# Patient Record
Sex: Female | Born: 1995 | Race: White | Hispanic: No | Marital: Married | Smoking: Never smoker
Health system: Southern US, Community
[De-identification: ages and names within clinical notes are randomized; demographics above are authoritative.]

## PROBLEM LIST (undated history)

## (undated) DIAGNOSIS — R102 Pelvic and perineal pain: Secondary | ICD-10-CM

## (undated) DIAGNOSIS — T8859XA Other complications of anesthesia, initial encounter: Secondary | ICD-10-CM

## (undated) DIAGNOSIS — N809 Endometriosis, unspecified: Secondary | ICD-10-CM

## (undated) DIAGNOSIS — G8929 Other chronic pain: Secondary | ICD-10-CM

## (undated) DIAGNOSIS — F32A Depression, unspecified: Secondary | ICD-10-CM

## (undated) DIAGNOSIS — F329 Major depressive disorder, single episode, unspecified: Secondary | ICD-10-CM

## (undated) DIAGNOSIS — F419 Anxiety disorder, unspecified: Secondary | ICD-10-CM

## (undated) HISTORY — DX: Anxiety disorder, unspecified: F41.9

## (undated) HISTORY — DX: Depression, unspecified: F32.A

## (undated) HISTORY — PX: NO PAST SURGERIES: SHX2092

## (undated) HISTORY — DX: Major depressive disorder, single episode, unspecified: F32.9

---

## 2016-11-28 ENCOUNTER — Emergency Department: Payer: 59

## 2016-11-28 ENCOUNTER — Encounter: Payer: Self-pay | Admitting: Emergency Medicine

## 2016-11-28 ENCOUNTER — Emergency Department
Admission: EM | Admit: 2016-11-28 | Discharge: 2016-11-28 | Disposition: A | Discharge status: Home / Self Care | Payer: 59 | Attending: Emergency Medicine | Admitting: Emergency Medicine

## 2016-11-28 ENCOUNTER — Telehealth: Payer: Self-pay | Admitting: Emergency Medicine

## 2016-11-28 DIAGNOSIS — N83202 Unspecified ovarian cyst, left side: Secondary | ICD-10-CM | POA: Diagnosis not present

## 2016-11-28 DIAGNOSIS — A749 Chlamydial infection, unspecified: Secondary | ICD-10-CM | POA: Insufficient documentation

## 2016-11-28 DIAGNOSIS — N83201 Unspecified ovarian cyst, right side: Secondary | ICD-10-CM

## 2016-11-28 DIAGNOSIS — R1030 Lower abdominal pain, unspecified: Secondary | ICD-10-CM

## 2016-11-28 LAB — COMPREHENSIVE METABOLIC PANEL
ALBUMIN: 4.6 g/dL (ref 3.5–5.0)
ALK PHOS: 53 U/L (ref 38–126)
ALT: 13 U/L — ABNORMAL LOW (ref 14–54)
ANION GAP: 5 (ref 5–15)
AST: 19 U/L (ref 15–41)
BUN: 15 mg/dL (ref 6–20)
CHLORIDE: 110 mmol/L (ref 101–111)
CO2: 25 mmol/L (ref 22–32)
Calcium: 9.2 mg/dL (ref 8.9–10.3)
Creatinine, Ser: 0.9 mg/dL (ref 0.44–1.00)
GFR calc non Af Amer: >60 mL/min (ref >60)
GLUCOSE: 101 mg/dL — AB (ref 65–99)
Potassium: 3.8 mmol/L (ref 3.5–5.1)
SODIUM: 140 mmol/L (ref 135–145)
Total Bilirubin: 0.8 mg/dL (ref 0.3–1.2)
Total Protein: 8.3 g/dL — ABNORMAL HIGH (ref 6.5–8.1)

## 2016-11-28 LAB — URINALYSIS, COMPLETE (UACMP) WITH MICROSCOPIC
Bilirubin Urine: NEGATIVE
GLUCOSE, UA: NEGATIVE mg/dL
KETONES UR: NEGATIVE mg/dL
Leukocytes, UA: NEGATIVE
Nitrite: POSITIVE — AB
PROTEIN: NEGATIVE mg/dL
Specific Gravity, Urine: 1.027 (ref 1.005–1.030)
pH: 5 (ref 5.0–8.0)

## 2016-11-28 LAB — CBC
HEMATOCRIT: 41.9 % (ref 35.0–47.0)
HEMOGLOBIN: 14.6 g/dL (ref 12.0–16.0)
MCH: 29.8 pg (ref 26.0–34.0)
MCHC: 34.8 g/dL (ref 32.0–36.0)
MCV: 85.6 fL (ref 80.0–100.0)
Platelets: 281 10*3/uL (ref 150–440)
RBC: 4.89 MIL/uL (ref 3.80–5.20)
RDW: 12.9 % (ref 11.5–14.5)
WBC: 8.6 10*3/uL (ref 3.6–11.0)

## 2016-11-28 LAB — CHLAMYDIA/NGC RT PCR (ARMC ONLY)
CHLAMYDIA TR: DETECTED — AB
N gonorrhoeae: NOT DETECTED

## 2016-11-28 LAB — POCT PREGNANCY, URINE: PREG TEST UR: NEGATIVE

## 2016-11-28 LAB — LIPASE, BLOOD: LIPASE: 35 U/L (ref 11–51)

## 2016-11-28 LAB — WET PREP, GENITAL
CLUE CELLS WET PREP: NONE SEEN
Sperm: NONE SEEN
Trich, Wet Prep: NONE SEEN
Yeast Wet Prep HPF POC: NONE SEEN

## 2016-11-28 MED ORDER — OXYCODONE-ACETAMINOPHEN 5-325 MG PO TABS: 1.0000 | ORAL_TABLET | ORAL | 0 refills | Status: DC | PRN

## 2016-11-28 MED ORDER — OXYCODONE-ACETAMINOPHEN 5-325 MG PO TABS
1.0000 | ORAL_TABLET | Freq: Once | ORAL | Status: AC
  Administered 2016-11-28: 1 via ORAL
  Filled 2016-11-28: qty 1

## 2016-11-28 NOTE — ED Provider Notes (Signed)
[facility] Emergency Department Provider Note    First MD Initiated Contact with Patient 11/28/16 [phone]     (approximate)  I have reviewed the triage vital signs and the nursing notes.   HISTORY  Chief Complaint No chief complaint on file.   HPI Heidi Lyons is a 21 y.o. female presents to the emergency department with 10 out of 10 pelvic pain 2 weeks which is increased in intensity since having her birth control implant replaced in her arm on Tuesday (Implanon). Patient states that she discussed this with her gynecologist who advised her that this was "normal". Patient denies any fever no urinary symptoms no vaginal discharge. Patient does admit to unprotected sex with previous history of Chlamydia. Patient denies any vomiting or diarrhea. Patient denies any constipation. Of note patient states that she did not have a pelvic exam performed before Implanon placement   Past medical history Chlamydia There are no active problems to display for this patient.    None  Prior to Admission medications   Medication Sig Start Date End Date Taking? Authorizing Provider  etonogestrel (NEXPLANON) 68 MG IMPL implant 1 each by Subdermal route once.   Yes [provider]  meloxicam (MOBIC) 7.5 MG tablet Take 1 tablet by mouth daily. 11/21/16  Yes [provider]  oxyCODONE-acetaminophen (ROXICET) 5-325 MG tablet Take 1 tablet by mouth every 4 (four) hours as needed for severe pain. 11/28/16   Darci CurrentBrown, Fleetwood N, MD    Allergies No known drug allergies History reviewed. No pertinent family history.  Social History Social History  Substance Use Topics  . Smoking status: Never Smoker  . Smokeless tobacco: Never Used  . Alcohol use Yes    Review of Systems Constitutional: No fever/chills Eyes: No visual changes. ENT: No sore throat. Cardiovascular: Denies chest pain. Respiratory: Denies shortness of breath. Gastrointestinal: No abdominal  pain.  No nausea, no vomiting.  No diarrhea.  No constipation. Genitourinary: Negative for dysuria.Positive for pelvic pain Musculoskeletal: Negative for neck pain.  Negative for back pain. Integumentary: Negative for rash. Neurological: Negative for headaches, focal weakness or numbness.   ____________________________________________   PHYSICAL EXAM:  VITAL SIGNS: ED Triage Vitals [11/28/16 0431]  Enc Vitals Group     BP 130/63     Pulse Rate 92     Resp 16     Temp 98.1 F (36.7 C)     Temp Source Oral     SpO2 99 %     Weight 59.9 kg (132 lb)     Height 1.6 m (5\' 3" )     Head Circumference      Peak Flow      Pain Score      Pain Loc      Pain Edu?      Excl. in GC?     Constitutional: Alert and oriented. Well appearing and in no acute distress. Eyes: Conjunctivae are normal. Head: Atraumatic. Mouth/Throat: Mucous membranes are moist. Neck: No stridor.   Cardiovascular: Normal rate, regular rhythm. Good peripheral circulation. Grossly normal heart sounds. Respiratory: Normal respiratory effort.  No retractions. Lungs CTAB. Gastrointestinal: Soft and nontender. No distention.  Genitourinary: Scant vaginal bleeding noted (patient menstruating) however yellowish white vaginal discharge also noted Musculoskeletal: No lower extremity tenderness nor edema. No gross deformities of extremities. Neurologic:  Normal speech and language. No gross focal neurologic deficits are appreciated.  Skin:  Skin is warm, dry and intact. No rash noted. Psychiatric: Mood and affect are  normal. Speech and behavior are normal.  ____________________________________________   LABS (all labs ordered are listed, but only abnormal results are displayed)  Labs Reviewed  CHLAMYDIA/NGC RT PCR ([facility] ONLY) - Abnormal; Notable for the following:       Result Value   Chlamydia Tr DETECTED (*)    All other components within normal limits  WET PREP, GENITAL - Abnormal; Notable for the  following:    WBC, Wet Prep HPF POC FEW (*)    All other components within normal limits  COMPREHENSIVE METABOLIC PANEL - Abnormal; Notable for the following:    Glucose, Bld 101 (*)    Total Protein 8.3 (*)    ALT 13 (*)    All other components within normal limits  URINALYSIS, COMPLETE (UACMP) WITH MICROSCOPIC - Abnormal; Notable for the following:    Color, Urine YELLOW (*)    APPearance HAZY (*)    Hgb urine dipstick SMALL (*)    Nitrite POSITIVE (*)    Bacteria, UA RARE (*)    Squamous Epithelial / LPF 0-5 (*)    All other components within normal limits  LIPASE, BLOOD  CBC  POC URINE PREG, ED  POCT PREGNANCY, URINE   _  RADIOLOGY I, [place] Trini Soldo, personally viewed and evaluated these images (plain radiographs) as part of my medical decision making, as well as reviewing the written report by the radiologist.  US Transvaginal Non-ob  Result Date: 11/28/2016 CLINICAL DATA:  Lower abdominal pain for 2 weeks EXAM: TRANSABDOMINAL AND TRANSVAGINAL ULTRASOUND OF PELVIS TECHNIQUE: Both transabdominal and transvaginal ultrasound examinations of the pelvis were performed. Transabdominal technique was performed for global imaging of the pelvis including uterus, ovaries, adnexal regions, and pelvic cul-de-sac. It was necessary to proceed with endovaginal exam following the transabdominal exam to visualize the ovaries. COMPARISON:  None FINDINGS: Uterus Measurements: 7.0 x 3.9 x 4.2 cm. No fibroids or other mass visualized. Endometrium Thickness: 4.4 mm.  No focal abnormality visualized. Right ovary Measurements: 3.6 x 2.3 x 2.8 cm. 2.3 cm simple unilocular cyst. Left ovary Measurements: 2.9 x 1.8 x 2.1 cm. 1.9 cm simple unilocular cyst. Other findings No abnormal free fluid. IMPRESSION: Normal uterus and ovaries.  No abnormal pelvic fluid collections. Electronically Signed   By: Ellery Plunk M.D.   On: 11/28/2016 06:44   US Pelvis Complete  Result Date: 11/28/2016 CLINICAL DATA:   Lower abdominal pain for 2 weeks EXAM: TRANSABDOMINAL AND TRANSVAGINAL ULTRASOUND OF PELVIS TECHNIQUE: Both transabdominal and transvaginal ultrasound examinations of the pelvis were performed. Transabdominal technique was performed for global imaging of the pelvis including uterus, ovaries, adnexal regions, and pelvic cul-de-sac. It was necessary to proceed with endovaginal exam following the transabdominal exam to visualize the ovaries. COMPARISON:  None FINDINGS: Uterus Measurements: 7.0 x 3.9 x 4.2 cm. No fibroids or other mass visualized. Endometrium Thickness: 4.4 mm.  No focal abnormality visualized. Right ovary Measurements: 3.6 x 2.3 x 2.8 cm. 2.3 cm simple unilocular cyst. Left ovary Measurements: 2.9 x 1.8 x 2.1 cm. 1.9 cm simple unilocular cyst. Other findings No abnormal free fluid. IMPRESSION: Normal uterus and ovaries.  No abnormal pelvic fluid collections. Electronically Signed   By: Ellery Plunk M.D.   On: 11/28/2016 06:44    Procedures   ____________________________________________   INITIAL IMPRESSION / ASSESSMENT AND PLAN / ED COURSE  Pertinent labs & imaging results that were available during my care of the patient were reviewed by me and considered in my medical decision making (see  chart for details).  21 year old female presenting to the emergency department pelvic pain ultrasound revealed too cyst one on each side. Concern for possible STD given history of Chlamydia and unprotected sex as such pelvic exam performed with lab data pending however patient will not remain in the emergency Department for the results. We will notify the nurse navigator to notify the patient of any positive findings.    ____________________________________________  FINAL CLINICAL IMPRESSION(S) / ED DIAGNOSES  Final diagnoses:  Cysts of both ovaries  Chlamydia   MEDICATIONS GIVEN DURING THIS VISIT:  Medications  oxyCODONE-acetaminophen (PERCOCET/ROXICET) 5-325 MG per tablet 1 tablet  (1 tablet Oral Given 11/28/16 0539)     NEW OUTPATIENT MEDICATIONS STARTED DURING THIS VISIT:  Discharge Medication List as of 11/28/2016  7:05 AM    START taking these medications   Details  oxyCODONE-acetaminophen (ROXICET) 5-325 MG tablet Take 1 tablet by mouth every 4 (four) hours as needed for severe pain., Starting Wed 11/28/2016, Print        Discharge Medication List as of 11/28/2016  7:05 AM      Discharge Medication List as of 11/28/2016  7:05 AM       Note:  This document was prepared using Dragon voice recognition software and may include unintentional dictation errors.    Darci Current, MD 11/28/16 2249

## 2016-11-28 NOTE — Telephone Encounter (Addendum)
Called patient to review std test results.  Patient was not treated in the ED.  The number listed is no longer in service.  I will send a letter.     Contacted patient via mom.  Explained result and need for treatment and partner treatment.  Will call med to [facility].

## 2016-11-28 NOTE — ED Triage Notes (Signed)
Pt  To triage in wheelchair due to pain. Pt reports she has had lower bilateral abdominal pain x2 weeks that has increased in intensity since having her birthcontrol implant placed in arm on Tuesday. Pt on period menses currently.

## 2016-11-29 NOTE — Telephone Encounter (Signed)
Per dr brown mom was present during visit and patient said her mom could be aware of her condition. Called mom and asked her on voicemail to [facility]  Patient needs azithromycin 1 gram po per dr brown and to follow up at [facility] or pcp

## 2017-01-25 ENCOUNTER — Emergency Department: Payer: 59

## 2017-01-25 ENCOUNTER — Encounter: Payer: Self-pay | Admitting: Emergency Medicine

## 2017-01-25 DIAGNOSIS — N83209 Unspecified ovarian cyst, unspecified side: Secondary | ICD-10-CM | POA: Insufficient documentation

## 2017-01-25 DIAGNOSIS — Z5321 Procedure and treatment not carried out due to patient leaving prior to being seen by health care provider: Secondary | ICD-10-CM | POA: Diagnosis not present

## 2017-01-25 DIAGNOSIS — R102 Pelvic and perineal pain: Secondary | ICD-10-CM | POA: Diagnosis present

## 2017-01-25 LAB — URINALYSIS, COMPLETE (UACMP) WITH MICROSCOPIC
BACTERIA UA: NONE SEEN
BILIRUBIN URINE: NEGATIVE
Glucose, UA: NEGATIVE mg/dL
KETONES UR: NEGATIVE mg/dL
NITRITE: NEGATIVE
PH: 7 (ref 5.0–8.0)
Protein, ur: NEGATIVE mg/dL
Specific Gravity, Urine: 1.011 (ref 1.005–1.030)

## 2017-01-25 LAB — POCT PREGNANCY, URINE: PREG TEST UR: NEGATIVE

## 2017-01-25 NOTE — ED Triage Notes (Signed)
Pt comes into the ED via POV c/o pelvic pain that has gotten worse tonight.  Patient states she has been following up with her OBGN and is supposed to have surgery in the next couple weeks to determine if it is related to endometriosis.  Patient states she also has a h/o ovarian cysts.  Patient in NAD at this time with even and unlabored respirations.

## 2017-01-26 ENCOUNTER — Emergency Department
Admission: EM | Admit: 2017-01-26 | Discharge: 2017-01-26 | Disposition: A | Discharge status: Home / Self Care | Payer: 59 | Attending: Emergency Medicine | Admitting: Emergency Medicine

## 2017-01-26 DIAGNOSIS — N83209 Unspecified ovarian cyst, unspecified side: Secondary | ICD-10-CM

## 2017-01-26 NOTE — ED Notes (Signed)
Pt updated on wait time and is understanding.  

## 2017-01-26 NOTE — ED Notes (Signed)
Pt updated on wait time and states she is going to go home and go to see a doctor in the morning. Pt encouraged to stay but did not wish to wait any longer.

## 2017-01-28 ENCOUNTER — Telehealth: Payer: Self-pay | Admitting: Emergency Medicine

## 2017-01-28 NOTE — Telephone Encounter (Signed)
Patient called asking for results of tests done prior to leaving without treatment.  She has been seeing gyn for this pain and has lap scheduled--dr beasely--caron jones.   I explained that there is no critical result, but I would recommend she call caron jones to inform her of this pain and that ultrasound and ua were done here and need review.  Pt agrees.

## 2017-01-31 ENCOUNTER — Encounter
Admission: RE | Admit: 2017-01-31 | Discharge: 2017-01-31 | Disposition: A | Discharge status: Home / Self Care | Payer: 59 | Source: Ambulatory Visit | Attending: Obstetrics and Gynecology | Admitting: Obstetrics and Gynecology

## 2017-01-31 NOTE — H&P (Signed)
HPI: She states this worsened after getting the Nexplanon 2 yrs ago and is negatively impacting her quality of life. It has  been worsening over time, and started with menarche at age 21yo. She states the pain is constant, chronic, worsens 1-2 days prior to periods, resolves during period and comes back just after. Resting makes it better, moving makes it worse. Narcotics did not change the pain. She has also tried OCPs x3 versions without success.   She seems depressed and defeated, and has a flat affect today.  She has had prior TVUS, which was done on 01/10/17 and demonstrated simple cysts bilaterally, cervical fluid, no evidence of hydrosalpinx, +pelvic free fluid.  Labs: normal. No GC/CT on 01/10/17 here but Chlamydia positive and incompletely [facility]. No prior hx of STDs of which she is aware.   Past Medical History:  has a past medical history of Chlamydia and History of chickenpox.  Past Surgical History:  has no past surgical history on file. Family History: family history includes Breast cancer in her mother. Social History:  reports that she has never smoked. She has never used smokeless tobacco. She reports that she drinks alcohol. She reports that she does not use drugs. OB/GYN History:          OB History    Gravida Para Term Preterm AB Living   0 0 0 0 0 0   SAB TAB Ectopic Molar Multiple Live Births   0 0 0   0        Allergies: has No Known Allergies. Medications:  Current Outpatient Prescriptions:  .  doxycycline (MONODOX) 100 MG capsule, Take 1 capsule (100 mg total) by mouth 2 (two) times daily., Disp: 28 capsule, Rfl: 0 .  meloxicam (MOBIC) 7.5 MG tablet, TAKE 1 TABLET (7.5 MG TOTAL) BY MOUTH ONCE DAILY., Disp: 30 tablet, Rfl: 0 .  metroNIDAZOLE (FLAGYL) 500 MG tablet, Take 1 tablet (500 mg total) by mouth 2 (two) times daily for 14 days., Disp: 28 tablet, Rfl: 0 .  metroNIDAZOLE (METROGEL) 0.75 % vaginal gel, Place 1 applicator  vaginally nightly., Disp: 70 g, Rfl: 0 .  oxyCODONE (OXYCONTIN) 10 MG CR tablet, Take 10 mg by mouth every 12 (twelve) hours as needed for Pain., Disp: , Rfl:  .  danazol (DANOCRINE) 100 MG capsule, Take 1 capsule (100 mg total) by mouth 2 (two) times daily., Disp: 60 capsule, Rfl: 3 .  pregabalin (LYRICA) 150 MG capsule, Take 1 capsule (150 mg total) by mouth 2 (two) times daily., Disp: 60 capsule, Rfl: 3   Review of Systems: No SOB, no palpitations or chest pain, no new lower extremity edema, no nausea or vomiting or bowel or bladder complaints. See HPI for gyn specific ROS.   Exam:   BP 122/67   Pulse 74   Ht 157.5 cm (5\' 2" )   Wt 59.9 kg (132 lb)   BMI 24.14 kg/m   General: Patient is well-groomed, well-nourished, appears stated age in no acute distress  HEENT: head is atraumatic and normocephalic, trachea is midline, neck is supple with no palpable nodules  CV: Regular rhythm and normal heart rate, no murmur  Pulm: Clear to auscultation throughout lung fields with no wheezing, crackles, or rhonchi. No increased work of breathing  Abdomen: soft , no mass, diffusely-tender in R and L lower quadrants, no rebound tenderness, no hepatomegaly  Pelvic:  Deferred at patient's request until surgery  Impression:   The encounter diagnosis was Pelvic  pain in female.    Plan:   Chronic Pelvic pain: I have reviewed the patient's history and physical exam.  I reviewed the differential diagnosis of pelvic pain, including GYN, urologic, gastrointestinal, musculoskeletal, neurological and psychological causes. She has tried OCPs and now Nexplanon for years without resolution. I have offered her a trial of Depo Lupron or Depo provera, a referral to the pelvic pain clinic at [facility], and a dx lap. We will also trial Lyrica and danazol prior to the surgery to see if any improvement.  At this time she would like to proceed with exploratory laparoscopy, and we will do  chromopertubation as well. If feel this is reasonable given her recent dx of CT with incomplete treatment. She is aware that this surgery may not resolve any of her pain, and that I will refer her to the chronic pelvic pain clinic if that is the case. Possible LOA, possible fulguration of endometriosis.   She was in the ER earlier this week for pelvic pain, but was not seen by a doctor.   The patient and I discussed the technical aspects of the procedure including the potential for risks and complications. These include but are not limited to the risk of infection requiring post-operative antibiotics or further procedures. We talked about the risk of injury to adjacent organs including bladder, bowel, ureter, blood vessels or nerves. We talked about the need to convert to an open incision. We talked about the possible need for blood transfusion. We talked aboutpostop complications such asthromboembolic or cardiopulmonary complications. All of her questions were answered.  Her preoperative exam was completed and the appropriate consents were signed. She is scheduled to undergo this procedure in the near future.  Specific Peri-operative Considerations:  - Consent: obtained today  - Labs: CBC, CMP preoperatively  - Bowel Preparation: None required - Abx:  Doxycycline 2 days prior and 3 day after procedure - VTE ppx: SCDs perioperatively - Glucose Protocol: n/a - Beta-blockade: n/a

## 2017-01-31 NOTE — Patient Instructions (Signed)
Your procedure is scheduled on: 02-08-17 FRIDAY Report to Same Day Surgery 2nd floor medical mall [facility](Medical Mall Entrance-take elevator on left to 2nd floor.  Check in with surgery information desk.) To find out your arrival time please call [phone] between 1PM - 3PM on 02-07-17 THURSDAY  Remember: Instructions that are not followed completely may result in serious medical risk, up to and including death, or upon the discretion of your surgeon and anesthesiologist your surgery may need to be rescheduled.    _x___ 1. Do not eat food after midnight the night before your procedure. NO GUM CHEWING OR HARD CANDIES. You may drink clear liquids up to 2 hours before you are scheduled to arrive at the hospital for your procedure.  Do not drink clear liquids within 2 hours of your scheduled arrival to the hospital.  Clear liquids include  --Water or Apple juice without pulp  --Clear carbohydrate beverage such as ClearFast or Gatorade  --Black Coffee or Clear Tea (No milk, no creamers, do not add anything to the coffee or Tea)  Type 1 and type 2 diabetics should only drink water.     __x__ 2. No Alcohol for 24 hours before or after surgery.   __x__3. No Smoking for 24 prior to surgery.   ____  4. Bring all medications with you on the day of surgery if instructed.    __x__ 5. Notify your doctor if there is any change in your medical condition     (cold, fever, infections).     Do not wear jewelry, make-up, hairpins, clips or nail polish.  Do not wear lotions, powders, or perfumes. You may wear deodorant.  Do not shave 48 hours prior to surgery. Men may shave face and neck.  Do not bring valuables to the hospital.    [facility] is not responsible for any belongings or valuables.               Contacts, dentures or bridgework may not be worn into surgery.  Leave your suitcase in the car. After surgery it may be brought to your room.  For patients admitted to the hospital, discharge time is  determined by your                       treatment team.   Patients discharged the day of surgery will not be allowed to drive home.  You will need someone to drive you home and stay with you the night of your procedure.    Please read over the following fact sheets that you were given:   [facility] Preparing for Surgery and or MRSA Information   _x___ TAKE THE FOLLOWING MEDICATIONS THE MORNING OF SURGERY WITH A SMALL SIP OF WATER. These include:  1. LYRICA  2.  3.  4.  5.  6.  ____Fleets enema or Magnesium Citrate as directed.   _x___ Use CHG Soap or sage wipes as directed on instruction sheet   ____ Use inhalers on the day of surgery and bring to hospital day of surgery  ____ Stop Metformin and Janumet 2 days prior to surgery.    ____ Take 1/2 of usual insulin dose the night before surgery and none on the morning surgery.   ____ Follow recommendations from Cardiologist, Pulmonologist or PCP regarding stopping Aspirin, Coumadin, Plavix ,Eliquis, Effient, or Pradaxa, and Pletal.  X____Stop Anti-inflammatories such as Advil, Aleve, Ibuprofen, Motrin, Naproxen, Naprosyn, Goodies powders or aspirin products NOW-OK to take Tylenol  ____ Stop supplements until after surgery.     ____ Bring C-Pap to the hospital.

## 2017-02-05 ENCOUNTER — Encounter
Admission: RE | Admit: 2017-02-05 | Discharge: 2017-02-05 | Disposition: A | Discharge status: Home / Self Care | Payer: 59 | Source: Ambulatory Visit | Attending: Obstetrics and Gynecology | Admitting: Obstetrics and Gynecology

## 2017-02-05 DIAGNOSIS — G8929 Other chronic pain: Secondary | ICD-10-CM | POA: Diagnosis not present

## 2017-02-05 DIAGNOSIS — Z803 Family history of malignant neoplasm of breast: Secondary | ICD-10-CM | POA: Diagnosis not present

## 2017-02-05 DIAGNOSIS — R102 Pelvic and perineal pain: Secondary | ICD-10-CM | POA: Diagnosis present

## 2017-02-05 DIAGNOSIS — N801 Endometriosis of ovary: Secondary | ICD-10-CM | POA: Diagnosis not present

## 2017-02-05 LAB — CBC
HEMATOCRIT: 44.4 % (ref 35.0–47.0)
Hemoglobin: 15.3 g/dL (ref 12.0–16.0)
MCH: 30.2 pg (ref 26.0–34.0)
MCHC: 34.4 g/dL (ref 32.0–36.0)
MCV: 87.8 fL (ref 80.0–100.0)
Platelets: 298 10*3/uL (ref 150–440)
RBC: 5.06 MIL/uL (ref 3.80–5.20)
RDW: 13.2 % (ref 11.5–14.5)
WBC: 7 10*3/uL (ref 3.6–11.0)

## 2017-02-05 LAB — TYPE AND SCREEN
ABO/RH(D): B POS
Antibody Screen: NEGATIVE

## 2017-02-05 LAB — BASIC METABOLIC PANEL
ANION GAP: 9 (ref 5–15)
BUN: 14 mg/dL (ref 6–20)
CALCIUM: 9.5 mg/dL (ref 8.9–10.3)
CO2: 26 mmol/L (ref 22–32)
Chloride: 106 mmol/L (ref 101–111)
Creatinine, Ser: 0.9 mg/dL (ref 0.44–1.00)
GFR calc Af Amer: >60 mL/min (ref >60)
GFR calc non Af Amer: >60 mL/min (ref >60)
GLUCOSE: 80 mg/dL (ref 65–99)
POTASSIUM: 3.6 mmol/L (ref 3.5–5.1)
Sodium: 141 mmol/L (ref 135–145)

## 2017-02-07 NOTE — Pre-Procedure Instructions (Signed)
SPOKE WITH OR REGARDING 2 EAR PIERCINGS THAT PT IS UNABLE TO REMOVE-  OR AWARE AND WILL PLACE TAPE OVER PIERCINGS DURING SURGERY

## 2017-02-08 ENCOUNTER — Encounter
Admission: RE | Disposition: A | Discharge status: Home / Self Care | Payer: Self-pay | Source: Ambulatory Visit | Attending: Obstetrics and Gynecology

## 2017-02-08 ENCOUNTER — Ambulatory Visit
Admission: RE | Admit: 2017-02-08 | Discharge: 2017-02-08 | Disposition: A | Discharge status: Home / Self Care | Payer: 59 | Source: Ambulatory Visit | Attending: Obstetrics and Gynecology | Admitting: Obstetrics and Gynecology

## 2017-02-08 ENCOUNTER — Encounter: Payer: Self-pay | Admitting: *Deleted

## 2017-02-08 ENCOUNTER — Ambulatory Visit: Payer: 59 | Admitting: Anesthesiology

## 2017-02-08 DIAGNOSIS — G8929 Other chronic pain: Secondary | ICD-10-CM | POA: Insufficient documentation

## 2017-02-08 DIAGNOSIS — N801 Endometriosis of ovary: Secondary | ICD-10-CM | POA: Insufficient documentation

## 2017-02-08 DIAGNOSIS — R102 Pelvic and perineal pain: Secondary | ICD-10-CM | POA: Insufficient documentation

## 2017-02-08 DIAGNOSIS — Z803 Family history of malignant neoplasm of breast: Secondary | ICD-10-CM | POA: Insufficient documentation

## 2017-02-08 HISTORY — PX: CHROMOPERTUBATION: SHX6288

## 2017-02-08 HISTORY — PX: LAPAROSCOPY: SHX197

## 2017-02-08 LAB — POCT PREGNANCY, URINE: PREG TEST UR: NEGATIVE

## 2017-02-08 LAB — ABO/RH: ABO/RH(D): B POS

## 2017-02-08 SURGERY — LAPAROSCOPY OPERATIVE
Anesthesia: General | Wound class: Clean Contaminated

## 2017-02-08 MED ORDER — DOCUSATE SODIUM 100 MG PO CAPS: 100.0000 mg | ORAL_CAPSULE | Freq: Two times a day (BID) | ORAL | 0 refills | Status: DC

## 2017-02-08 MED ORDER — IBUPROFEN 800 MG PO TABS: 800.0000 mg | ORAL_TABLET | Freq: Three times a day (TID) | ORAL | 1 refills | Status: DC | PRN

## 2017-02-08 MED ORDER — SUCCINYLCHOLINE CHLORIDE 20 MG/ML IJ SOLN
INTRAMUSCULAR | Status: AC
  Filled 2017-02-08: qty 1

## 2017-02-08 MED ORDER — ROCURONIUM BROMIDE 100 MG/10ML IV SOLN
INTRAVENOUS | Status: DC | PRN
  Administered 2017-02-08: 30 mg via INTRAVENOUS
  Administered 2017-02-08: 5 mg via INTRAVENOUS
  Administered 2017-02-08: 10 mg via INTRAVENOUS

## 2017-02-08 MED ORDER — MIDAZOLAM HCL 2 MG/2ML IJ SOLN
INTRAMUSCULAR | Status: AC
  Filled 2017-02-08: qty 2

## 2017-02-08 MED ORDER — FENTANYL CITRATE (PF) 100 MCG/2ML IJ SOLN
25.0000 ug | INTRAMUSCULAR | Status: DC | PRN
  Administered 2017-02-08 (×2): 25 ug via INTRAVENOUS

## 2017-02-08 MED ORDER — FAMOTIDINE 20 MG PO TABS
20.0000 mg | ORAL_TABLET | Freq: Once | ORAL | Status: AC
  Administered 2017-02-08: 20 mg via ORAL

## 2017-02-08 MED ORDER — FENTANYL CITRATE (PF) 100 MCG/2ML IJ SOLN
INTRAMUSCULAR | Status: AC
  Filled 2017-02-08: qty 2

## 2017-02-08 MED ORDER — MIDAZOLAM HCL 2 MG/2ML IJ SOLN
INTRAMUSCULAR | Status: DC | PRN
  Administered 2017-02-08: 2 mg via INTRAVENOUS

## 2017-02-08 MED ORDER — SUGAMMADEX SODIUM 200 MG/2ML IV SOLN
INTRAVENOUS | Status: DC | PRN
  Administered 2017-02-08: 120 mg via INTRAVENOUS

## 2017-02-08 MED ORDER — LACTATED RINGERS IV SOLN: INTRAVENOUS | Status: DC

## 2017-02-08 MED ORDER — ACETAMINOPHEN 500 MG PO TABS: 1000.0000 mg | ORAL_TABLET | Freq: Four times a day (QID) | ORAL | 0 refills | Status: AC

## 2017-02-08 MED ORDER — ONDANSETRON HCL 4 MG/2ML IJ SOLN
INTRAMUSCULAR | Status: AC
  Filled 2017-02-08: qty 2

## 2017-02-08 MED ORDER — PROPOFOL 10 MG/ML IV BOLUS
INTRAVENOUS | Status: DC | PRN
Start: 2017-02-08 — End: 2017-02-08
  Administered 2017-02-08: 130 mg via INTRAVENOUS

## 2017-02-08 MED ORDER — SILVER NITRATE-POT NITRATE 75-25 % EX MISC
CUTANEOUS | Status: DC | PRN
  Administered 2017-02-08: 2

## 2017-02-08 MED ORDER — FENTANYL CITRATE (PF) 100 MCG/2ML IJ SOLN
INTRAMUSCULAR | Status: AC
  Administered 2017-02-08: 25 ug via INTRAVENOUS
  Filled 2017-02-08: qty 2

## 2017-02-08 MED ORDER — DEXAMETHASONE SODIUM PHOSPHATE 10 MG/ML IJ SOLN
INTRAMUSCULAR | Status: DC | PRN
  Administered 2017-02-08: 4 mg via INTRAVENOUS

## 2017-02-08 MED ORDER — BUPIVACAINE HCL 0.5 % IJ SOLN
INTRAMUSCULAR | Status: DC | PRN
  Administered 2017-02-08: 15 mL

## 2017-02-08 MED ORDER — ONDANSETRON HCL 4 MG/2ML IJ SOLN
INTRAMUSCULAR | Status: DC | PRN
  Administered 2017-02-08: 4 mg via INTRAVENOUS

## 2017-02-08 MED ORDER — LIDOCAINE HCL (CARDIAC) 20 MG/ML IV SOLN
INTRAVENOUS | Status: DC | PRN
  Administered 2017-02-08: 50 mg via INTRAVENOUS

## 2017-02-08 MED ORDER — OXYCODONE HCL 5 MG PO TABS
ORAL_TABLET | ORAL | Status: AC
  Administered 2017-02-08: 5 mg via ORAL
  Filled 2017-02-08: qty 1

## 2017-02-08 MED ORDER — LIDOCAINE HCL (PF) 2 % IJ SOLN
INTRAMUSCULAR | Status: AC
  Filled 2017-02-08: qty 2

## 2017-02-08 MED ORDER — FAMOTIDINE 20 MG PO TABS
ORAL_TABLET | ORAL | Status: AC
  Filled 2017-02-08: qty 1

## 2017-02-08 MED ORDER — OXYCODONE HCL 5 MG PO CAPS: 5.0000 mg | ORAL_CAPSULE | Freq: Four times a day (QID) | ORAL | 0 refills | Status: DC | PRN

## 2017-02-08 MED ORDER — METHYLENE BLUE 0.5 % INJ SOLN
INTRAVENOUS | Status: DC | PRN
  Administered 2017-02-08: 5 mL via SUBMUCOSAL

## 2017-02-08 MED ORDER — ONDANSETRON HCL 4 MG/2ML IJ SOLN: 4.0000 mg | Freq: Once | INTRAMUSCULAR | Status: DC | PRN

## 2017-02-08 MED ORDER — METHYLENE BLUE 0.5 % INJ SOLN
INTRAVENOUS | Status: AC
  Filled 2017-02-08: qty 10

## 2017-02-08 MED ORDER — PROPOFOL 10 MG/ML IV BOLUS
INTRAVENOUS | Status: AC
  Filled 2017-02-08: qty 20

## 2017-02-08 MED ORDER — OXYCODONE HCL 5 MG PO TABS
5.0000 mg | ORAL_TABLET | Freq: Four times a day (QID) | ORAL | Status: DC | PRN
  Administered 2017-02-08: 5 mg via ORAL
  Filled 2017-02-08 (×2): qty 1

## 2017-02-08 MED ORDER — FENTANYL CITRATE (PF) 100 MCG/2ML IJ SOLN
INTRAMUSCULAR | Status: DC | PRN
  Administered 2017-02-08 (×2): 50 ug via INTRAVENOUS

## 2017-02-08 MED ORDER — PHENYLEPHRINE HCL 10 MG/ML IJ SOLN
INTRAMUSCULAR | Status: AC
  Filled 2017-02-08: qty 1

## 2017-02-08 MED ORDER — SUGAMMADEX SODIUM 200 MG/2ML IV SOLN
INTRAVENOUS | Status: AC
  Filled 2017-02-08: qty 2

## 2017-02-08 MED ORDER — KETOROLAC TROMETHAMINE 30 MG/ML IJ SOLN
INTRAMUSCULAR | Status: DC | PRN
  Administered 2017-02-08: 30 mg via INTRAVENOUS

## 2017-02-08 MED ORDER — ROCURONIUM BROMIDE 50 MG/5ML IV SOLN
INTRAVENOUS | Status: AC
  Filled 2017-02-08: qty 1

## 2017-02-08 MED ORDER — DEXAMETHASONE SODIUM PHOSPHATE 10 MG/ML IJ SOLN
INTRAMUSCULAR | Status: AC
  Filled 2017-02-08: qty 1

## 2017-02-08 MED ORDER — SUCCINYLCHOLINE CHLORIDE 20 MG/ML IJ SOLN
INTRAMUSCULAR | Status: DC | PRN
Start: 2017-02-08 — End: 2017-02-08
  Administered 2017-02-08: 70 mg via INTRAVENOUS

## 2017-02-08 MED ORDER — PHENYLEPHRINE HCL 10 MG/ML IJ SOLN
INTRAMUSCULAR | Status: DC | PRN
Start: 2017-02-08 — End: 2017-02-08
  Administered 2017-02-08 (×2): 100 ug via INTRAVENOUS
  Administered 2017-02-08 (×3): 50 ug via INTRAVENOUS
  Administered 2017-02-08: 100 ug via INTRAVENOUS

## 2017-02-08 MED ORDER — LACTATED RINGERS IV SOLN
INTRAVENOUS | Status: DC
  Administered 2017-02-08: 07:00:00 via INTRAVENOUS

## 2017-02-08 MED ORDER — BUPIVACAINE HCL (PF) 0.5 % IJ SOLN
INTRAMUSCULAR | Status: AC
  Filled 2017-02-08: qty 30

## 2017-02-08 MED ORDER — SILVER NITRATE-POT NITRATE 75-25 % EX MISC
CUTANEOUS | Status: AC
  Filled 2017-02-08: qty 1

## 2017-02-08 MED ORDER — KETOROLAC TROMETHAMINE 30 MG/ML IJ SOLN
INTRAMUSCULAR | Status: AC
  Filled 2017-02-08: qty 1

## 2017-02-08 SURGICAL SUPPLY — 40 items
BAG URO DRAIN 2000ML W/SPOUT (MISCELLANEOUS) IMPLANT
BLADE SURG SZ11 CARB STEEL (BLADE) ×4 IMPLANT
CATH FOLEY 2WAY  5CC 16FR (CATHETERS)
CATH ROBINSON RED A/P 16FR (CATHETERS) ×4 IMPLANT
CATH URTH 16FR FL 2W BLN LF (CATHETERS) IMPLANT
CHLORAPREP W/TINT 26ML (MISCELLANEOUS) ×4 IMPLANT
CLOSURE WOUND 1/4X4 (GAUZE/BANDAGES/DRESSINGS) ×1
DERMABOND ADVANCED (GAUZE/BANDAGES/DRESSINGS) ×2
DERMABOND ADVANCED .7 DNX12 (GAUZE/BANDAGES/DRESSINGS) ×2 IMPLANT
DRSG TEGADERM 2-3/8X2-3/4 SM (GAUZE/BANDAGES/DRESSINGS) ×12 IMPLANT
GAUZE SPONGE NON-WVN 2X2 STRL (MISCELLANEOUS) ×4 IMPLANT
GLOVE BIO SURGEON STRL SZ7 (GLOVE) ×16 IMPLANT
GLOVE INDICATOR 7.5 STRL GRN (GLOVE) ×16 IMPLANT
GOWN STRL REUS W/ TWL LRG LVL3 (GOWN DISPOSABLE) ×4 IMPLANT
GOWN STRL REUS W/TWL LRG LVL3 (GOWN DISPOSABLE) ×4
IRRIGATION STRYKERFLOW (MISCELLANEOUS) IMPLANT
IRRIGATOR STRYKERFLOW (MISCELLANEOUS)
IV NS 1000ML (IV SOLUTION) ×2
IV NS 1000ML BAXH (IV SOLUTION) ×2 IMPLANT
KIT PINK PAD W/HEAD ARE REST (MISCELLANEOUS) ×4
KIT PINK PAD W/HEAD ARM REST (MISCELLANEOUS) ×2 IMPLANT
KIT RM TURNOVER CYSTO AR (KITS) ×4 IMPLANT
LABEL OR SOLS (LABEL) ×4 IMPLANT
LIGASURE LAP MARYLAND 5MM 37CM (ELECTROSURGICAL) IMPLANT
NS IRRIG 500ML POUR BTL (IV SOLUTION) ×4 IMPLANT
PACK GYN LAPAROSCOPIC (MISCELLANEOUS) ×4 IMPLANT
PAD OB MATERNITY 4.3X12.25 (PERSONAL CARE ITEMS) ×4 IMPLANT
PAD PREP 24X41 OB/GYN DISP (PERSONAL CARE ITEMS) ×4 IMPLANT
POUCH SPECIMEN RETRIEVAL 10MM (ENDOMECHANICALS) IMPLANT
SCISSORS METZENBAUM CVD 33 (INSTRUMENTS) IMPLANT
SLEEVE ENDOPATH XCEL 5M (ENDOMECHANICALS) ×8 IMPLANT
SPONGE VERSALON 2X2 STRL (MISCELLANEOUS) ×4
STRIP CLOSURE SKIN 1/4X4 (GAUZE/BANDAGES/DRESSINGS) ×3 IMPLANT
SUT MNCRL AB 4-0 PS2 18 (SUTURE) ×4 IMPLANT
SUT VIC AB 2-0 UR6 27 (SUTURE) IMPLANT
SUT VIC AB 4-0 SH 27 (SUTURE)
SUT VIC AB 4-0 SH 27XANBCTRL (SUTURE) IMPLANT
SWABSTK COMLB BENZOIN TINCTURE (MISCELLANEOUS) ×4 IMPLANT
TROCAR XCEL NON-BLD 5MMX100MML (ENDOMECHANICALS) ×4 IMPLANT
TUBING INSUFFLATOR HI FLOW (MISCELLANEOUS) ×4 IMPLANT

## 2017-02-08 NOTE — Op Note (Signed)
Heidi Lyons PROCEDURE DATE: 02/08/2017  PREOPERATIVE DIAGNOSIS: Chronic pelvic pain POSTOPERATIVE DIAGNOSIS: Endometriosis, bilateral tubal patency PROCEDURE: Diagnostic laparoscopy, fulguration of endometriosis, posterior cul de sac peritoneal stripping, peritoneal biopsies, excision of endometriosis, chromotubation SURGEON:  Dr. Christeen DouglasBethany Jathen Sudano ASSISTANT: CST ANESTHESIOLOGIST: Berdine Addisonhomas, Mathai, MD Anesthesiologist: Berdine Addisonhomas, Mathai, MD CRNA: Casey BurkittHoang, Thuy, CRNA; Mathews ArgyleLogan, Benjamin, CRNA  INDICATIONS: 21 y.o. G0 with history of chronic pelvic pain desiring surgical evaluation.   Please see preoperative notes for further details.  Risks of surgery were discussed with the patient including but not limited to: bleeding which may require transfusion or reoperation; infection which may require antibiotics; injury to bowel, bladder, ureters or other surrounding organs; need for additional procedures including laparotomy; thromboembolic phenomenon, incisional problems and other postoperative/anesthesia complications. Written informed consent was obtained.    FINDINGS:  Small uterus, normal ovaries and fallopian tubes bilaterally. Diffuse Stage II endometriosis with nothing evident on the anterior peritoneum. Scar tissue noted bilateral uterosacral segments. Scar tissue bilaterally ovarian fossa. 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 Some adhesions between the left rectosigmoid and the left pelvic sidewall. Peritoneal biopsies were taken and sent to pathology. No other abdominal/pelvic abnormality.  Normal upper abdomen.  ANESTHESIA:    General INTRAVENOUS FLUIDS: 500 ml ESTIMATED BLOOD LOSS: minimal URINE OUTPUT: 50 ml SPECIMENS: Peritoneal biopsies COMPLICATIONS: None immediate  PROCEDURE IN DETAIL:  The patient had sequential compression devices applied to her lower extremities while in the preoperative area.  She was then taken to the operating room where general  anesthesia was administered and was found to be adequate.  She was placed in the dorsal lithotomy position, and was prepped and draped in a sterile manner.  A Foley catheter was inserted into her bladder and attached to constant drainage and a uterine manipulator was then advanced into the uterus .  After an adequate timeout was performed, attention was turned to the abdomen where an umbilical incision was made with the scalpel.  The Optiview 5-mm trocar and sleeve were then advanced without difficulty with the laparoscope under direct visualization into the abdomen.  The abdomen was then insufflated with carbon dioxide gas and adequate pneumoperitoneum was obtained.   A detailed survey of the patient's pelvis and abdomen revealed the findings as mentioned above. Chromotubation revealed bilateral spillage.  Biopsy forceps were used to take peritoneal biopsies.    The posterior cul de sac peritoneum was carefully dissected away from the underlying tissue and removed. Careful attention was paid to the bowels to avoid cautery or sharp injury. The endometriosis over the uterosacral ligaments was excised and touch cautery used to maintain hemostasis and fulgurate endometriosis implants and scarring.  The operative site was surveyed, and it was found to be hemostatic.  No intraoperative injury to surrounding organs was noted. Bilateral ureters were vermiculating.   Pictures were taken of the quadrants and pelvis. The abdomen was desufflated and all instruments were then removed from the patient's abdomen. The uterine manipulator was removed without complications.  All incisions were closed with 4-0 Vicryl and Dermabond.   The patient tolerated the procedures well.  All instruments, needles, and sponge counts were correct x 2. The patient was taken to the recovery room in stable condition.

## 2017-02-08 NOTE — Anesthesia Preprocedure Evaluation (Signed)
Anesthesia Evaluation  Patient identified by MRN, date of birth, ID band Patient awake    Reviewed: Allergy & Precautions, NPO status , Patient's Chart, lab work & pertinent test results, reviewed documented beta blocker date and time   Airway Mallampati: II  TM Distance: >3 FB     Dental  (+) Chipped   Pulmonary           Cardiovascular      Neuro/Psych    GI/Hepatic   Endo/Other    Renal/GU      Musculoskeletal   Abdominal   Peds  Hematology   Anesthesia Other Findings   Reproductive/Obstetrics                             Anesthesia Physical Anesthesia Plan  ASA: II  Anesthesia Plan: General   Post-op Pain Management:    Induction: Intravenous  PONV Risk Score and Plan:   Airway Management Planned: Oral ETT  Additional Equipment:   Intra-op Plan:   Post-operative Plan:   Informed Consent: I have reviewed the patients History and Physical, chart, labs and discussed the procedure including the risks, benefits and alternatives for the proposed anesthesia with the patient or authorized representative who has indicated his/her understanding and acceptance.     Plan Discussed with: CRNA  Anesthesia Plan Comments:         Anesthesia Quick Evaluation  

## 2017-02-08 NOTE — Anesthesia Postprocedure Evaluation (Signed)
Anesthesia Post Note  Patient: Heidi Lyons  Procedure(s) Performed: Procedure(s) (LRB): LAPAROSCOPY OPERATIVE WITH PERITONEAL BIOPSIES AND FULGERATION OF ENDOMETRIOSIS (N/A) CHROMOPERTUBATION (Bilateral)  Patient location during evaluation: PACU Anesthesia Type: General Level of consciousness: awake and alert Pain management: pain level controlled Vital Signs Assessment: post-procedure vital signs reviewed and stable Respiratory status: spontaneous breathing, nonlabored ventilation, respiratory function stable and patient connected to nasal cannula oxygen Cardiovascular status: blood pressure returned to baseline and stable Postop Assessment: no signs of nausea or vomiting Anesthetic complications: no     Last Vitals:  Vitals:   02/08/17 1146 02/08/17 1215  BP:  (!) 103/59  Pulse: 70   Resp:  18  Temp: 37.1 C   SpO2: 100% 100%    Last Pain:  Vitals:   02/08/17 1233  TempSrc:   PainSc: 4                  Keondria Siever S

## 2017-02-08 NOTE — Interval H&P Note (Signed)
History and Physical Interval Note:  02/08/2017 7:44 AM  Heidi Lyons  has presented today for surgery, with the diagnosis of Chronic Pelvic Pain  The various methods of treatment have been discussed with the patient and family. After consideration of risks, benefits and other options for treatment, the patient has consented to  Procedure(s): LAPAROSCOPY OPERATIVE (N/A) CHROMOPERTUBATION (Bilateral) as a surgical intervention .  The patient's history has been reviewed, patient examined, no change in status, stable for surgery.  I have reviewed the patient's chart and labs.  Questions were answered to the patient's satisfaction.     Christeen DouglasBethany Ebelin Dillehay

## 2017-02-08 NOTE — Anesthesia Post-op Follow-up Note (Signed)
Anesthesia QCDR form completed.        

## 2017-02-08 NOTE — Transfer of Care (Signed)
Immediate Anesthesia Transfer of Care Note  Patient: Heidi RuskMallery Lyons  Procedure(s) Performed: Procedure(s): LAPAROSCOPY OPERATIVE WITH PERITONEAL BIOPSIES AND FULGERATION OF ENDOMETRIOSIS (N/A) CHROMOPERTUBATION (Bilateral)  Patient Location: PACU  Anesthesia Type:General  Level of Consciousness: sedated  Airway & Oxygen Therapy: Patient Spontanous Breathing and Patient connected to face mask oxygen  Post-op Assessment: Report given to RN and Post -op Vital signs reviewed and stable  Post vital signs: Reviewed and stable  Last Vitals:  Vitals:   02/08/17 0629 02/08/17 0929  BP: 129/62 114/72  Pulse: 82 64  Resp: 18 12  Temp: 36.8 C   SpO2: 94% 100%    Last Pain:  Vitals:   02/08/17 0629  TempSrc: Oral  PainSc: 8       Patients Stated Pain Goal: 2 (02/08/17 0629)  Complications: No apparent anesthesia complications

## 2017-02-08 NOTE — Anesthesia Postprocedure Evaluation (Signed)
Anesthesia Post Note  Patient: Heidi RuskMallery Lyons  Procedure(s) Performed: Procedure(s) (LRB): LAPAROSCOPY OPERATIVE WITH PERITONEAL BIOPSIES AND FULGERATION OF ENDOMETRIOSIS (N/A) CHROMOPERTUBATION (Bilateral)  Patient location during evaluation: PACU Anesthesia Type: General Level of consciousness: awake and alert Pain management: pain level controlled Vital Signs Assessment: post-procedure vital signs reviewed and stable Respiratory status: spontaneous breathing, nonlabored ventilation, respiratory function stable and patient connected to nasal cannula oxygen Cardiovascular status: blood pressure returned to baseline and stable Postop Assessment: no signs of nausea or vomiting Anesthetic complications: no     Last Vitals:  Vitals:   02/08/17 1129 02/08/17 1146  BP: 106/71   Pulse: 82 70  Resp: (!) 25   Temp:  37.1 C  SpO2: 96% 100%    Last Pain:  Vitals:   02/08/17 1146  TempSrc:   PainSc: 8                  Ario Mcdiarmid S

## 2017-02-08 NOTE — Anesthesia Procedure Notes (Signed)
Procedure Name: Intubation Performed by: Claudie Rathbone Pre-anesthesia Checklist: Patient identified, Patient being monitored, Timeout performed, Emergency Drugs available and Suction available Patient Re-evaluated:Patient Re-evaluated prior to induction Oxygen Delivery Method: Circle system utilized Preoxygenation: Pre-oxygenation with 100% oxygen Induction Type: IV induction Ventilation: Mask ventilation without difficulty Laryngoscope Size: Mac and 3 Grade View: Grade I Tube type: Oral Tube size: 7.0 mm Number of attempts: 1 Airway Equipment and Method: Stylet Placement Confirmation: ETT inserted through vocal cords under direct vision,  positive ETCO2 and breath sounds checked- equal and bilateral Secured at: 21 cm Tube secured with: Tape Dental Injury: Teeth and Oropharynx as per pre-operative assessment        

## 2017-02-08 NOTE — Discharge Instructions (Signed)
Laparoscopic Ovarian Surgery Discharge Instructions ° °RISKS AND COMPLICATIONS  °· Infection. °· Bleeding. °· Injury to surrounding organs. °· Anesthetic side effects. ° ° °PROCEDURE  °· You may be given a medicine to help you relax (sedative) before the procedure. You will be given a medicine to make you sleep (general anesthetic) during the procedure. °· A tube will be put down your throat to help your breath while under general anesthesia. °· Several small cuts (incisions) are made in the lower abdominal area and one incision is made near the belly button. °· Your abdominal area will be inflated with a safe gas (carbon dioxide). This helps give the surgeon room to operate, visualize, and helps the surgeon avoid other organs. °· A thin, lighted tube (laparoscope) with a camera attached is inserted into your abdomen through the incision near the belly button. Other small instruments may also be inserted through other abdominal incisions. °· The ovary is located and are removed. °· After the ovary is removed, the gas is released from the abdomen. °· The incisions will be closed with stitches (sutures), and Dermabond. A bandage may be placed over the incisions. ° °AFTER THE PROCEDURE  °· You will also have some mild abdominal discomfort for 3-7 days. You will be given pain medicine to ease any discomfort. °· As long as there are no problems, you may be allowed to go home. Someone will need to drive you home and be with you for at least 24 hours once home. °· You may have some mild discomfort in the throat. This is from the tube placed in your throat while you were sleeping. °· You may experience discomfort in the shoulder area from some trapped air between the liver and diaphragm. This sensation is normal and will slowly go away on its own. ° °HOME CARE INSTRUCTIONS  °· Take all medicines as directed. °· Only take over-the-counter or prescription medicines for pain, discomfort, or fever as directed by your  caregiver. °· Resume daily activities as directed. °· Showers are preferred over baths for 2 weeks. °· You may resume sexual activities in 1 week or as you feel you would like to. °· Do not drive while taking narcotics. ° °SEEK MEDICAL CARE IF: . °· There is increasing abdominal pain. °· You feel lightheaded or faint. °· You have the chills. °· You have an oral temperature above 102° F (38.9° C). °· There is pus-like (purulent) drainage from any of the wounds. °· You are unable to pass gas or have a bowel movement. °· You feel sick to your stomach (nauseous) or throw up (vomit) and can't control it with your medicines. ° °MAKE SURE YOU:  °· Understand these instructions. °· Will watch your condition. °· Will get help right away if you are not doing well or get worse. ° °[employer]® Patient Information ©2013 [employer]. ° ° ° ° ° °

## 2017-02-08 NOTE — Addendum Note (Signed)
Addendum  created 02/08/17 1318 by Berdine Addisonhomas, Maciah Schweigert, MD   Sign clinical note

## 2017-02-11 LAB — SURGICAL PATHOLOGY

## 2017-04-01 ENCOUNTER — Emergency Department
Admission: EM | Admit: 2017-04-01 | Discharge: 2017-04-01 | Disposition: A | Discharge status: Home / Self Care | Payer: 59 | Attending: Emergency Medicine | Admitting: Emergency Medicine

## 2017-04-01 ENCOUNTER — Encounter: Payer: Self-pay | Admitting: *Deleted

## 2017-04-01 DIAGNOSIS — Z79899 Other long term (current) drug therapy: Secondary | ICD-10-CM | POA: Insufficient documentation

## 2017-04-01 DIAGNOSIS — N390 Urinary tract infection, site not specified: Secondary | ICD-10-CM | POA: Insufficient documentation

## 2017-04-01 DIAGNOSIS — R1030 Lower abdominal pain, unspecified: Secondary | ICD-10-CM | POA: Diagnosis present

## 2017-04-01 DIAGNOSIS — R109 Unspecified abdominal pain: Secondary | ICD-10-CM

## 2017-04-01 LAB — URINALYSIS, COMPLETE (UACMP) WITH MICROSCOPIC
Bilirubin Urine: NEGATIVE
GLUCOSE, UA: NEGATIVE mg/dL
KETONES UR: NEGATIVE mg/dL
NITRITE: POSITIVE — AB
PH: 6 (ref 5.0–8.0)
Protein, ur: 30 mg/dL — AB
Specific Gravity, Urine: 1.021 (ref 1.005–1.030)

## 2017-04-01 LAB — COMPREHENSIVE METABOLIC PANEL
ALK PHOS: 51 U/L (ref 38–126)
ALT: 13 U/L — ABNORMAL LOW (ref 14–54)
ANION GAP: 7 (ref 5–15)
AST: 20 U/L (ref 15–41)
Albumin: 4 g/dL (ref 3.5–5.0)
BILIRUBIN TOTAL: 0.8 mg/dL (ref 0.3–1.2)
BUN: 14 mg/dL (ref 6–20)
CALCIUM: 9.1 mg/dL (ref 8.9–10.3)
CO2: 25 mmol/L (ref 22–32)
Chloride: 107 mmol/L (ref 101–111)
Creatinine, Ser: 0.8 mg/dL (ref 0.44–1.00)
Glucose, Bld: 104 mg/dL — ABNORMAL HIGH (ref 65–99)
POTASSIUM: 3.9 mmol/L (ref 3.5–5.1)
Sodium: 139 mmol/L (ref 135–145)
TOTAL PROTEIN: 7.6 g/dL (ref 6.5–8.1)

## 2017-04-01 LAB — CBC
HCT: 41.3 % (ref 35.0–47.0)
HEMOGLOBIN: 14 g/dL (ref 12.0–16.0)
MCH: 29.5 pg (ref 26.0–34.0)
MCHC: 33.8 g/dL (ref 32.0–36.0)
MCV: 87.2 fL (ref 80.0–100.0)
Platelets: 317 10*3/uL (ref 150–440)
RBC: 4.73 MIL/uL (ref 3.80–5.20)
RDW: 12.6 % (ref 11.5–14.5)
WBC: 7.6 10*3/uL (ref 3.6–11.0)

## 2017-04-01 LAB — PREGNANCY, URINE: Preg Test, Ur: NEGATIVE

## 2017-04-01 LAB — LIPASE, BLOOD: Lipase: 34 U/L (ref 11–51)

## 2017-04-01 MED ORDER — CEPHALEXIN 500 MG PO CAPS: 500.0000 mg | ORAL_CAPSULE | Freq: Three times a day (TID) | ORAL | 0 refills | Status: AC

## 2017-04-01 MED ORDER — OXYCODONE-ACETAMINOPHEN 5-325 MG PO TABS
1.0000 | ORAL_TABLET | Freq: Once | ORAL | Status: AC
  Administered 2017-04-01: 1 via ORAL

## 2017-04-01 MED ORDER — OXYCODONE HCL 5 MG PO CAPS: 5.0000 mg | ORAL_CAPSULE | Freq: Four times a day (QID) | ORAL | 0 refills | Status: DC | PRN

## 2017-04-01 MED ORDER — CEPHALEXIN 500 MG PO CAPS
500.0000 mg | ORAL_CAPSULE | Freq: Once | ORAL | Status: AC
  Administered 2017-04-01: 500 mg via ORAL

## 2017-04-01 MED ORDER — CEPHALEXIN 500 MG PO CAPS
ORAL_CAPSULE | ORAL | Status: AC
  Filled 2017-04-01: qty 1

## 2017-04-01 MED ORDER — ONDANSETRON HCL 4 MG PO TABS: 4.0000 mg | ORAL_TABLET | Freq: Three times a day (TID) | ORAL | 0 refills | Status: DC | PRN

## 2017-04-01 MED ORDER — OXYCODONE-ACETAMINOPHEN 5-325 MG PO TABS
ORAL_TABLET | ORAL | Status: AC
  Filled 2017-04-01: qty 1

## 2017-04-01 NOTE — ED Notes (Signed)
Patient ambulatory to lobby with NAD noted. Verbalized understanding of discharge instructions, prescriptions and follow-up care.  

## 2017-04-01 NOTE — ED Provider Notes (Addendum)
[facility] Emergency Department Provider Note  ____________________________________________   I have reviewed the triage vital signs and the nursing notes.   HISTORY  Chief Complaint Abdominal Pain    HPI Heidi Lyons is a 21 y.o. female presents today complaining of dysuria, urinary frequency, and suprapubic discomfort and some beginning discomfort in her left flank.  No vaginal discharge.  Patient states she has a history of ovarian cysts however she also has a history of abscess.  These symptoms are not consistent with that.  Patient denies any fever or chills.  She has had burning with her urination for the last couple days and gradually she is beginning suprapubic discomfort now radiating to the left flank.  No history of kidney stones. She denies pregnancy, she does not feel she has an STI she does not want to ER pelvic exam she states   History reviewed. No pertinent past medical history.  There are no active problems to display for this patient.   Past Surgical History:  Procedure Laterality Date  . CHROMOPERTUBATION Bilateral 02/08/2017   Procedure: CHROMOPERTUBATION;  Surgeon: [name], MD;  Location: [facility] ORS;  Service: Gynecology;  Laterality: Bilateral;  . LAPAROSCOPY N/A 02/08/2017   Procedure: LAPAROSCOPY OPERATIVE WITH PERITONEAL BIOPSIES AND FULGERATION OF ENDOMETRIOSIS;  Surgeon: [name], MD;  Location: [facility] ORS;  Service: Gynecology;  Laterality: N/A;  . NO PAST SURGERIES      Prior to Admission medications   Medication Sig Start Date End Date Taking? Authorizing Provider  danazol (DANOCRINE) 100 MG capsule Take 100 mg by mouth 2 (two) times daily. 01/15/17   [provider]  docusate sodium (COLACE) 100 MG capsule Take 1 capsule (100 mg total) by mouth 2 (two) times daily. To keep stools soft 02/08/17   [name], MD  doxycycline (MONODOX) 100 MG capsule Take 100 mg by mouth 2 (two) times daily. 01/10/17    [provider]  etonogestrel (NEXPLANON) 68 MG IMPL implant 1 each by Subdermal route once.    [provider]  ibuprofen (ADVIL,MOTRIN) 800 MG tablet Take 1 tablet (800 mg total) by mouth every 8 (eight) hours as needed for moderate pain. 02/08/17   [name], MD  ketoconazole (NIZORAL) 2 % shampoo Apply 1 application topically daily as needed. For rash 01/03/17   [provider]  LYRICA 150 MG capsule Take 150 mg by mouth 2 (two) times daily. 01/14/17   [provider]  metroNIDAZOLE (FLAGYL) 500 MG tablet Take 500 mg by mouth 2 (two) times daily. 01/10/17   [provider]  oxycodone (OXY-IR) 5 MG capsule Take 1 capsule (5 mg total) by mouth every 6 (six) hours as needed for pain. 02/08/17   [name], MD    Allergies Patient has no known allergies.  History reviewed. No pertinent family history.  Social History Social History  Substance Use Topics  . Smoking status: Never Smoker  . Smokeless tobacco: Never Used  . Alcohol use Yes     Comment: OCC    Review of Systems Constitutional: No fever/chills Eyes: No visual changes. ENT: No sore throat. No stiff neck no neck pain Cardiovascular: Denies chest pain. Respiratory: Denies shortness of breath. Gastrointestinal:   no vomiting.  No diarrhea.  No constipation. Genitourinary: +for dysuria. Musculoskeletal: Negative lower extremity swelling Skin: Negative for rash. Neurological: Negative for severe headaches, focal weakness or numbness.   ____________________________________________   PHYSICAL EXAM:  VITAL SIGNS: ED Triage Vitals  Enc Vitals Group  BP 04/01/17 1457 115/77     Pulse Rate 04/01/17 1457 100     Resp 04/01/17 1457 18     Temp 04/01/17 1457 98.4 F (36.9 C)     Temp Source 04/01/17 1457 Oral     SpO2 04/01/17 1457 100 %     Weight 04/01/17 1458 130 lb (59 kg)     Height 04/01/17 1458 5\' 3"  (1.6 m)     Head Circumference --      Peak Flow --       Pain Score 04/01/17 1457 10     Pain Loc --      Pain Edu? --      Excl. in GC? --     Constitutional: Alert and oriented. Well appearing and in no acute distress. Eyes: Conjunctivae are normal Head: Atraumatic HEENT: No congestion/rhinnorhea. Mucous membranes are moist.  Oropharynx non-erythematous Neck:   Nontender with no meningismus, no masses, no stridor Cardiovascular: Normal rate, regular rhythm. Grossly normal heart sounds.  Good peripheral circulation. Respiratory: Normal respiratory effort.  No retractions. Lungs CTAB. Abdominal: Soft and suprapubic discomfort. No distention. No guarding no rebound Back:  There is no focal tenderness or step off.  there is no midline tenderness there are no lesions noted. there is left CVA tenderness GU: pt declines, mother agrees.  Musculoskeletal: No lower extremity tenderness, no upper extremity tenderness. No joint effusions, no DVT signs strong distal pulses no edema Neurologic:  Normal speech and language. No gross focal neurologic deficits are appreciated.  Skin:  Skin is warm, dry and intact. No rash noted. Psychiatric: Mood and affect are normal. Speech and behavior are normal.  ____________________________________________   LABS (all labs ordered are listed, but only abnormal results are displayed)  Labs Reviewed  COMPREHENSIVE METABOLIC PANEL - Abnormal; Notable for the following:       Result Value   Glucose, Bld 104 (*)    ALT 13 (*)    All other components within normal limits  URINALYSIS, COMPLETE (UACMP) WITH MICROSCOPIC - Abnormal; Notable for the following:    Color, Urine YELLOW (*)    APPearance CLOUDY (*)    Hgb urine dipstick SMALL (*)    Protein, ur 30 (*)    Nitrite POSITIVE (*)    Leukocytes, UA LARGE (*)    Bacteria, UA MANY (*)    Squamous Epithelial / LPF 6-30 (*)    Non Squamous Epithelial 0-5 (*)    All other components within normal limits  URINE CULTURE  LIPASE, BLOOD  CBC  PREGNANCY, URINE     Pertinent labs  results that were available during my care of the patient were reviewed by me and considered in my medical decision making (see chart for details). ____________________________________________  EKG  I personally interpreted any EKGs ordered by me or triage  ____________________________________________  RADIOLOGY  Pertinent labs & imaging results that were available during my care of the patient were reviewed by me and considered in my medical decision making (see chart for details). If possible, patient and/or family made aware of any abnormal findings. ____________________________________________    PROCEDURES  Procedure(s) performed: None  Procedures  Critical Care performed: None  ____________________________________________   INITIAL IMPRESSION / ASSESSMENT AND PLAN / ED COURSE  Pertinent labs & imaging results that were available during my care of the patient were reviewed by me and considered in my medical decision making (see chart for details).  She with urinary frequency, dysuria, burning, urgency, and what  appears to be a clear urinary tract infection on her urinalysis.  No vaginal discharge.  Nothing to suggest TOA or PID or ovarian cyst or appendicitis.  I did suggest to her that we could get more information if I do a pelvic exam to help rule out those entities and she declines at this time.  We will send her home with pain medication nausea medication and antibiotics.  Urine culture is pending.  I do not have an old culture to base our treatment upon therefore we will empirically treat.  Return precautions and follow-up given and understood.  She has a ride home instructed not to drive on [place]ercoc[place] or drink on Percocet etc.   ____________________________________________   FINAL CLINICAL IMPRESSION(S) / ED DIAGNOSES  Final diagnoses:  None      This chart was dictated using voice recognition software.  Despite best efforts to proofread,   errors can occur which can change meaning.      Jeanmarie Plant, MD 04/01/17 1755    Jeanmarie Plant, MD 04/01/17 [phone]

## 2017-04-01 NOTE — ED Triage Notes (Signed)
Pt states lower abd pain that began this AM, states hx of ovarain cysts, denies any vaginal bleeding or discharge, states painful urination and nausea, awake and alert in no acute distress

## 2017-04-05 LAB — URINE CULTURE: Culture: >=100000 — AB

## 2017-04-07 NOTE — Progress Notes (Signed)
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 Abx needs to be changed. Called pt cell phone, no answer and voicemail not set up. Bactrim DS BID x 5 days was approved by Dr Roxan Hockeyobinson. Will attempt to call pt again  Olene FlossMelissa D Stormee Duda, Pharm.D, BCPS Clinical Pharmacist

## 2017-04-08 ENCOUNTER — Encounter: Payer: Self-pay | Admitting: Emergency Medicine

## 2017-04-08 DIAGNOSIS — N10 Acute tubulo-interstitial nephritis: Secondary | ICD-10-CM | POA: Diagnosis not present

## 2017-04-08 DIAGNOSIS — N730 Acute parametritis and pelvic cellulitis: Secondary | ICD-10-CM | POA: Insufficient documentation

## 2017-04-08 DIAGNOSIS — Z79899 Other long term (current) drug therapy: Secondary | ICD-10-CM | POA: Insufficient documentation

## 2017-04-08 DIAGNOSIS — R102 Pelvic and perineal pain: Secondary | ICD-10-CM | POA: Diagnosis present

## 2017-04-08 LAB — CBC WITH DIFFERENTIAL/PLATELET
Basophils Absolute: 0.1 10*3/uL (ref 0–0.1)
Basophils Relative: 1 %
EOS ABS: 0.2 10*3/uL (ref 0–0.7)
EOS PCT: 2 %
HCT: 44.6 % (ref 35.0–47.0)
Hemoglobin: 15.1 g/dL (ref 12.0–16.0)
LYMPHS ABS: 4 10*3/uL — AB (ref 1.0–3.6)
LYMPHS PCT: 42 %
MCH: 29.6 pg (ref 26.0–34.0)
MCHC: 33.9 g/dL (ref 32.0–36.0)
MCV: 87.3 fL (ref 80.0–100.0)
MONO ABS: 0.8 10*3/uL (ref 0.2–0.9)
MONOS PCT: 9 %
Neutro Abs: 4.5 10*3/uL (ref 1.4–6.5)
Neutrophils Relative %: 46 %
PLATELETS: 338 10*3/uL (ref 150–440)
RBC: 5.11 MIL/uL (ref 3.80–5.20)
RDW: 12.3 % (ref 11.5–14.5)
WBC: 9.6 10*3/uL (ref 3.6–11.0)

## 2017-04-08 LAB — COMPREHENSIVE METABOLIC PANEL
ALBUMIN: 4.3 g/dL (ref 3.5–5.0)
ALT: 14 U/L (ref 14–54)
AST: 20 U/L (ref 15–41)
Alkaline Phosphatase: 67 U/L (ref 38–126)
Anion gap: 8 (ref 5–15)
BUN: 14 mg/dL (ref 6–20)
CALCIUM: 9.1 mg/dL (ref 8.9–10.3)
CHLORIDE: 106 mmol/L (ref 101–111)
CO2: 25 mmol/L (ref 22–32)
CREATININE: 0.86 mg/dL (ref 0.44–1.00)
GFR calc non Af Amer: >60 mL/min (ref >60)
GLUCOSE: 90 mg/dL (ref 65–99)
Potassium: 4.1 mmol/L (ref 3.5–5.1)
SODIUM: 139 mmol/L (ref 135–145)
Total Bilirubin: 0.9 mg/dL (ref 0.3–1.2)
Total Protein: 8 g/dL (ref 6.5–8.1)

## 2017-04-08 LAB — URINALYSIS, ROUTINE W REFLEX MICROSCOPIC
BILIRUBIN URINE: NEGATIVE
Glucose, UA: NEGATIVE mg/dL
Hgb urine dipstick: NEGATIVE
KETONES UR: NEGATIVE mg/dL
Nitrite: NEGATIVE
PH: 6 (ref 5.0–8.0)
Protein, ur: NEGATIVE mg/dL
SPECIFIC GRAVITY, URINE: 1.019 (ref 1.005–1.030)

## 2017-04-08 LAB — LIPASE, BLOOD: Lipase: 39 U/L (ref 11–51)

## 2017-04-08 MED ORDER — ONDANSETRON 4 MG PO TBDP
ORAL_TABLET | ORAL | Status: AC
  Administered 2017-04-08: 4 mg
  Filled 2017-04-08: qty 1

## 2017-04-08 MED ORDER — FENTANYL CITRATE (PF) 100 MCG/2ML IJ SOLN
50.0000 ug | INTRAMUSCULAR | Status: AC | PRN
  Administered 2017-04-08 (×2): 50 ug via NASAL

## 2017-04-08 MED ORDER — ONDANSETRON HCL 4 MG/2ML IJ SOLN
4.0000 mg | Freq: Once | INTRAMUSCULAR | Status: DC
Start: 2017-04-08 — End: 2017-04-09

## 2017-04-08 MED ORDER — FENTANYL CITRATE (PF) 100 MCG/2ML IJ SOLN
INTRAMUSCULAR | Status: AC
  Administered 2017-04-08: 50 ug via NASAL
  Filled 2017-04-08: qty 2

## 2017-04-08 NOTE — ED Triage Notes (Signed)
Pt reports lower abdominal pain on and off, reports she was taking some antibiotics and was called today to pick up another antibiotics has not taken any of the new antibiotic. Pt crying in triage

## 2017-04-09 ENCOUNTER — Emergency Department
Admission: EM | Admit: 2017-04-09 | Discharge: 2017-04-09 | Disposition: A | Discharge status: Home / Self Care | Payer: 59 | Attending: Emergency Medicine | Admitting: Emergency Medicine

## 2017-04-09 ENCOUNTER — Emergency Department: Payer: 59

## 2017-04-09 DIAGNOSIS — N730 Acute parametritis and pelvic cellulitis: Secondary | ICD-10-CM

## 2017-04-09 DIAGNOSIS — R102 Pelvic and perineal pain: Secondary | ICD-10-CM

## 2017-04-09 DIAGNOSIS — N10 Acute tubulo-interstitial nephritis: Secondary | ICD-10-CM

## 2017-04-09 HISTORY — DX: Endometriosis, unspecified: N80.9

## 2017-04-09 LAB — WET PREP, GENITAL
Clue Cells Wet Prep HPF POC: NONE SEEN
Sperm: NONE SEEN
Trich, Wet Prep: NONE SEEN
Yeast Wet Prep HPF POC: NONE SEEN

## 2017-04-09 LAB — CHLAMYDIA/NGC RT PCR (ARMC ONLY)
CHLAMYDIA TR: NOT DETECTED
N gonorrhoeae: NOT DETECTED

## 2017-04-09 LAB — PREGNANCY, URINE: Preg Test, Ur: NEGATIVE

## 2017-04-09 MED ORDER — OXYCODONE-ACETAMINOPHEN 5-325 MG PO TABS: 1.0000 | ORAL_TABLET | ORAL | 0 refills | Status: DC | PRN

## 2017-04-09 MED ORDER — OXYCODONE-ACETAMINOPHEN 5-325 MG PO TABS
1.0000 | ORAL_TABLET | Freq: Once | ORAL | Status: AC
  Administered 2017-04-09: 1 via ORAL
  Filled 2017-04-09: qty 1

## 2017-04-09 MED ORDER — SULFAMETHOXAZOLE-TRIMETHOPRIM 800-160 MG PO TABS
1.0000 | ORAL_TABLET | Freq: Once | ORAL | Status: AC
  Administered 2017-04-09: 1 via ORAL
  Filled 2017-04-09: qty 1

## 2017-04-09 MED ORDER — CEFTRIAXONE SODIUM 250 MG IJ SOLR
250.0000 mg | Freq: Once | INTRAMUSCULAR | Status: AC
  Administered 2017-04-09: 250 mg via INTRAMUSCULAR
  Filled 2017-04-09: qty 250

## 2017-04-09 MED ORDER — AZITHROMYCIN 500 MG PO TABS
1000.0000 mg | ORAL_TABLET | Freq: Once | ORAL | Status: AC
  Administered 2017-04-09: 1000 mg via ORAL
  Filled 2017-04-09: qty 2

## 2017-04-09 MED ORDER — DOXYCYCLINE HYCLATE 100 MG PO TABS: 100.0000 mg | ORAL_TABLET | Freq: Two times a day (BID) | ORAL | 0 refills | Status: AC

## 2017-04-09 NOTE — ED Provider Notes (Signed)
[facility] Emergency Department Provider Note   First MD Initiated Contact with Patient 04/09/17 [id number]     (approximate)  I have reviewed the triage vital signs and the nursing notes.   HISTORY  Chief Complaint Abdominal Pain   HPI Heidi Lyons is a 21 y.o. female with history of endometriosis, chlamydia and recently diagnosed MRSA urinary tract infection 04/01/2017 presents to the emergency department with 8 out of 10 pelvic pain.  Patient denies any vaginal discharge.  Patient does admit to being sexually active with the same partner as she did back in June when she was diagnosed with chlamydia.  Patient states she does not know if her partner was tested or treated.   Past Medical History:  Diagnosis Date  . Endometriosis     There are no active problems to display for this patient.   Past Surgical History:  Procedure Laterality Date  . NO PAST SURGERIES      Prior to Admission medications   Medication Sig Start Date End Date Taking? Authorizing Provider  cephALEXin (KEFLEX) 500 MG capsule Take 1 capsule (500 mg total) by mouth 3 (three) times daily. 04/01/17 04/11/17  Jeanmarie Plant, MD  danazol (DANOCRINE) 100 MG capsule Take 100 mg by mouth 2 (two) times daily. 01/15/17   [provider]  docusate sodium (COLACE) 100 MG capsule Take 1 capsule (100 mg total) by mouth 2 (two) times daily. To keep stools soft 02/08/17   [name], MD  doxycycline (MONODOX) 100 MG capsule Take 100 mg by mouth 2 (two) times daily. 01/10/17   [provider]  doxycycline (VIBRA-TABS) 100 MG tablet Take 1 tablet (100 mg total) 2 (two) times daily for 14 days by mouth. 04/09/17 04/23/17  Darci Current, MD  etonogestrel (NEXPLANON) 68 MG IMPL implant 1 each by Subdermal route once.    [provider]  ibuprofen (ADVIL,MOTRIN) 800 MG tablet Take 1 tablet (800 mg total) by mouth every 8 (eight) hours as needed for moderate pain. 02/08/17    [name], MD  ketoconazole (NIZORAL) 2 % shampoo Apply 1 application topically daily as needed. For rash 01/03/17   [provider]  LYRICA 150 MG capsule Take 150 mg by mouth 2 (two) times daily. 01/14/17   [provider]  metroNIDAZOLE (FLAGYL) 500 MG tablet Take 500 mg by mouth 2 (two) times daily. 01/10/17   [provider]  ondansetron (ZOFRAN) 4 MG tablet Take 1 tablet (4 mg total) by mouth every 8 (eight) hours as needed for nausea or vomiting. 04/01/17   Jeanmarie Plant, MD  oxycodone (OXY-IR) 5 MG capsule Take 1 capsule (5 mg total) by mouth every 6 (six) hours as needed for pain. 04/01/17   Jeanmarie Plant, MD  oxyCODONE-acetaminophen (ROXICET) 5-325 MG tablet Take 1 tablet every 4 (four) hours as needed by mouth for severe pain. 04/09/17   Darci Current, MD    Allergies No known drug allergies  No family history on file.  Social History Social History   Tobacco Use  . Smoking status: Never Smoker  . Smokeless tobacco: Never Used  Substance Use Topics  . Alcohol use: Yes    Comment: OCC  . Drug use: No    Review of Systems Constitutional: No fever/chills Eyes: No visual changes. ENT: No sore throat. Cardiovascular: Denies chest pain. Respiratory: Denies shortness of breath. Gastrointestinal: No abdominal pain.  No nausea, no vomiting.  No diarrhea.  No constipation. Genitourinary:  Negative for dysuria. Musculoskeletal: Negative for neck pain.  Negative for back pain. Integumentary: Negative for rash. Neurological: Negative for headaches, focal weakness or numbness.  ____________________________________________   PHYSICAL EXAM:  VITAL SIGNS: ED Triage Vitals  Enc Vitals Group     BP 04/08/17 2207 (!) 141/81     Pulse Rate 04/08/17 2207 85     Resp 04/08/17 2207 20     Temp 04/08/17 2207 97.8 F (36.6 C)     Temp src --      SpO2 04/08/17 2207 99 %     Weight 04/08/17 2208 59.9 kg (132 lb)     Height 04/08/17 2208 1.6 m  (5\' 3" )     Head Circumference --      Peak Flow --      Pain Score 04/08/17 2207 10     Pain Loc --      Pain Edu? --      Excl. in GC? --     Constitutional: Alert and oriented. Well appearing and in no acute distress. Eyes: Conjunctivae are normal.  Head: Atraumatic. Mouth/Throat: Mucous membranes are moist.  Oropharynx non-erythematous. Neck: No stridor.  Cardiovascular: Normal rate, regular rhythm. Good peripheral circulation. Grossly normal heart sounds. Respiratory: Normal respiratory effort.  No retractions. Lungs CTAB. Gastrointestinal: Soft and nontender. No distention. Genitourinary: Copious vaginal discharge with discharge noted from the cervical os.  Cervical motion tenderness on exam. Musculoskeletal: No lower extremity tenderness nor edema. No gross deformities of extremities. Neurologic:  Normal speech and language. No gross focal neurologic deficits are appreciated.  Skin:  Skin is warm, dry and intact. No rash noted. Psychiatric: Mood and affect are normal. Speech and behavior are normal.  ____________________________________________   LABS (all labs ordered are listed, but only abnormal results are displayed)  Labs Reviewed  WET PREP, GENITAL - Abnormal; Notable for the following components:      Result Value   WBC, Wet Prep HPF POC MODERATE (*)    All other components within normal limits  CBC WITH DIFFERENTIAL/PLATELET - Abnormal; Notable for the following components:   Lymphs Abs 4.0 (*)    All other components within normal limits  URINALYSIS, ROUTINE W REFLEX MICROSCOPIC - Abnormal; Notable for the following components:   Color, Urine YELLOW (*)    APPearance CLOUDY (*)    Leukocytes, UA TRACE (*)    Bacteria, UA RARE (*)    Squamous Epithelial / LPF 6-30 (*)    All other components within normal limits  CHLAMYDIA/NGC RT PCR ([facility] ONLY)  COMPREHENSIVE METABOLIC PANEL  LIPASE, BLOOD  PREGNANCY, URINE    RADIOLOGY I, [employer] N BROWN,  personally viewed and evaluated these images (plain radiographs) as part of my medical decision making, as well as reviewing the written report by the radiologist.  [place] Pelvic Complete With Transvaginal  Result Date: 04/09/2017 CLINICAL DATA:  Diffuse pelvic pain for 2 weeks. Negative urine pregnancy test. EXAM: TRANSABDOMINAL AND TRANSVAGINAL ULTRASOUND OF PELVIS TECHNIQUE: Both transabdominal and transvaginal ultrasound examinations of the pelvis were performed. Transabdominal technique was performed for global imaging of the pelvis including uterus, ovaries, adnexal regions, and pelvic cul-de-sac. It was necessary to proceed with endovaginal exam following the transabdominal exam to visualize the ovaries and endometrium. COMPARISON:  None FINDINGS: Uterus Measurements: 7.5 x 4.6 x 5.7 cm. Uterus is anteverted. No fibroids or other mass visualized. Endometrium Thickness: 4.5 mm. No focal abnormality visualized. Small amount of fluid in the endocervical canal. Right ovary Measurements: 4.3  x 2.8 x 3.1 cm. Simple appearing cyst measuring up to 3 cm maximal diameter, likely functional. Left ovary Measurements: 3.6 x 1.7 x 1.9 cm. Normal appearance/no adnexal mass. Other findings Minimal free fluid in the pelvis. IMPRESSION: 1. No acute process demonstrated in the uterus or ovaries. No abnormal adnexal masses. 2. Small right ovarian cyst, small amount of fluid in the endocervical canal, and small amount of fluid in the pelvis are likely physiologic. Electronically Signed   By: Burman NievesWilliam  Stevens M.D.   On: 04/09/2017 04:21     Procedures   ____________________________________________   INITIAL IMPRESSION / ASSESSMENT AND PLAN / ED COURSE  As part of my medical decision making, I reviewed the following data within the electronic MEDICAL RECORD [id number] year old female presented with above-stated history pelvic pain copious vaginal discharge noted on exam.  Concern for possible PID and STD given above-stated  history. Patient given Ceftriaxone 250mg  IM will be prescribed doxycycline twice a day for 14 days.  Patient advised to begin taking antibiotic therapy for which she was prescribed for her urinary tract infection.    ____________________________________________  FINAL CLINICAL IMPRESSION(S) / ED DIAGNOSES  Final diagnoses:  Pelvic pain  PID (acute pelvic inflammatory disease)  Acute pyelonephritis     MEDICATIONS GIVEN DURING THIS VISIT:  Medications  ondansetron (ZOFRAN) injection 4 mg (not administered)  ondansetron (ZOFRAN-ODT) 4 MG disintegrating tablet (4 mg  Given 04/08/17 2223)  fentaNYL (SUBLIMAZE) injection 50 mcg (50 mcg Nasal Given 04/08/17 2333)  sulfamethoxazole-trimethoprim (BACTRIM DS,SEPTRA DS) 800-160 MG per tablet 1 tablet (1 tablet Oral Given 04/09/17 0131)  oxyCODONE-acetaminophen (PERCOCET/ROXICET) 5-325 MG per tablet 1 tablet (1 tablet Oral Given 04/09/17 0131)  cefTRIAXone (ROCEPHIN) injection 250 mg (250 mg Intramuscular Given 04/09/17 0423)  azithromycin (ZITHROMAX) tablet 1,000 mg (1,000 mg Oral Given 04/09/17 0423)       Note:  This document was prepared using Dragon voice recognition software and may include unintentional dictation errors.    Darci CurrentBrown, [place] N, MD 04/09/17 [phone]

## 2017-04-09 NOTE — ED Notes (Signed)
Patient back in room from [facility]

## 2017-04-09 NOTE — ED Notes (Signed)
Patient transported to US 

## 2017-04-09 NOTE — ED Notes (Addendum)
EDP at bedside for pelvic exam. This RN present to assist.

## 2017-04-09 NOTE — ED Notes (Signed)
ED Provider at bedside. 

## 2017-04-09 NOTE — ED Notes (Signed)
Pt ambulatory upon discharge. Pt and mother verbalized understanding of discharge instructions, follow-up care and prescriptions. Pt also verbalized importance of completing antibiotic regimen entirely. VSS. Pt A&O x4. Skin warm and dry.

## 2017-04-09 NOTE — ED Notes (Signed)
Pt c/o severe lower abdominal pain that began several hours ago. Pt denies any N/V/D. Pt denies any dysuria or painful urination.

## 2017-06-07 ENCOUNTER — Ambulatory Visit: Payer: Self-pay | Admitting: Physician Assistant

## 2017-10-15 ENCOUNTER — Other Ambulatory Visit: Payer: Self-pay

## 2017-10-15 ENCOUNTER — Emergency Department: Payer: Managed Care, Other (non HMO)

## 2017-10-15 ENCOUNTER — Emergency Department
Admission: EM | Admit: 2017-10-15 | Discharge: 2017-10-15 | Disposition: A | Discharge status: Home / Self Care | Payer: Managed Care, Other (non HMO) | Attending: Emergency Medicine | Admitting: Emergency Medicine

## 2017-10-15 DIAGNOSIS — R079 Chest pain, unspecified: Secondary | ICD-10-CM | POA: Diagnosis not present

## 2017-10-15 DIAGNOSIS — R102 Pelvic and perineal pain: Secondary | ICD-10-CM | POA: Diagnosis not present

## 2017-10-15 DIAGNOSIS — R109 Unspecified abdominal pain: Secondary | ICD-10-CM | POA: Diagnosis present

## 2017-10-15 DIAGNOSIS — N809 Endometriosis, unspecified: Secondary | ICD-10-CM | POA: Insufficient documentation

## 2017-10-15 LAB — LIPASE, BLOOD: Lipase: 38 U/L (ref 11–51)

## 2017-10-15 LAB — COMPREHENSIVE METABOLIC PANEL
ALBUMIN: 4.6 g/dL (ref 3.5–5.0)
ALT: 19 U/L (ref 14–54)
ANION GAP: 7 (ref 5–15)
AST: 23 U/L (ref 15–41)
Alkaline Phosphatase: 61 U/L (ref 38–126)
BILIRUBIN TOTAL: 0.9 mg/dL (ref 0.3–1.2)
BUN: 15 mg/dL (ref 6–20)
CO2: 26 mmol/L (ref 22–32)
Calcium: 9.4 mg/dL (ref 8.9–10.3)
Chloride: 105 mmol/L (ref 101–111)
Creatinine, Ser: 0.86 mg/dL (ref 0.44–1.00)
GFR calc Af Amer: >60 mL/min (ref >60)
GFR calc non Af Amer: >60 mL/min (ref >60)
GLUCOSE: 89 mg/dL (ref 65–99)
POTASSIUM: 3.6 mmol/L (ref 3.5–5.1)
Sodium: 138 mmol/L (ref 135–145)
Total Protein: 8.1 g/dL (ref 6.5–8.1)

## 2017-10-15 LAB — CBC
HEMATOCRIT: 43.9 % (ref 35.0–47.0)
Hemoglobin: 15.1 g/dL (ref 12.0–16.0)
MCH: 30.5 pg (ref 26.0–34.0)
MCHC: 34.4 g/dL (ref 32.0–36.0)
MCV: 88.8 fL (ref 80.0–100.0)
Platelets: 280 10*3/uL (ref 150–440)
RBC: 4.95 MIL/uL (ref 3.80–5.20)
RDW: 13.2 % (ref 11.5–14.5)
WBC: 8.1 10*3/uL (ref 3.6–11.0)

## 2017-10-15 LAB — POCT PREGNANCY, URINE: PREG TEST UR: NEGATIVE

## 2017-10-15 LAB — URINALYSIS, COMPLETE (UACMP) WITH MICROSCOPIC
BILIRUBIN URINE: NEGATIVE
Glucose, UA: NEGATIVE mg/dL
KETONES UR: NEGATIVE mg/dL
LEUKOCYTES UA: NEGATIVE
NITRITE: NEGATIVE
PROTEIN: NEGATIVE mg/dL
Specific Gravity, Urine: 1.02 (ref 1.005–1.030)
pH: 5 (ref 5.0–8.0)

## 2017-10-15 LAB — TROPONIN I: Troponin I: <0.03 ng/mL (ref <0.03)

## 2017-10-15 MED ORDER — OXYCODONE-ACETAMINOPHEN 5-325 MG PO TABS
2.0000 | ORAL_TABLET | Freq: Once | ORAL | Status: AC
  Administered 2017-10-15: 2 via ORAL
  Filled 2017-10-15: qty 2

## 2017-10-15 MED ORDER — OXYCODONE-ACETAMINOPHEN 5-325 MG PO TABS: 1.0000 | ORAL_TABLET | Freq: Three times a day (TID) | ORAL | 0 refills | Status: DC | PRN

## 2017-10-15 NOTE — ED Triage Notes (Signed)
Patient ambulatory to triage with steady gait, without difficulty or distress noted; pt reports this morning having left lower abd pain and upper CP with no accomp symptoms; st hx of same with endometriosis

## 2017-10-15 NOTE — ED Notes (Signed)
Pt ambulatory to POV without difficulty. VSS. NAD. Discharge, RX and follow up discussed. All questions addressed.

## 2017-10-15 NOTE — ED Notes (Signed)
First Nurse Note:  Patient ambulatory to Rm 12 with Mother.  Tresa Endo RN aware of placement in room.  Bed in low position, given a gown to wear.

## 2017-10-15 NOTE — ED Provider Notes (Addendum)
[facility] Emergency Department Provider Note       Time seen: ----------------------------------------- 8:56 AM on 10/15/2017 -----------------------------------------   I have reviewed the triage vital signs and the nursing notes.  HISTORY   Chief Complaint Abdominal Pain and Chest Pain    HPI Heidi Lyons is a 22 y.o. female with a history of endometriosis who presents to the ED for lower abdominal pain upper chest pain with no accompanying symptoms.  Patient has a history of same with endometriosis.  Reportedly the left lower quadrant pain is worse than normal but in the same location as prior.  She had an ultrasound last week and was put on some medication that begins with an ill but the family cannot member the name of.  She denies fevers, chills, vomiting or diarrhea.  She does have light vaginal bleeding but is Artie had her period this month.  Past Medical History:  Diagnosis Date  . Endometriosis     There are no active problems to display for this patient.   Past Surgical History:  Procedure Laterality Date  . CHROMOPERTUBATION Bilateral 02/08/2017   Procedure: CHROMOPERTUBATION;  Surgeon: [name], MD;  Location: [facility] ORS;  Service: Gynecology;  Laterality: Bilateral;  . LAPAROSCOPY N/A 02/08/2017   Procedure: LAPAROSCOPY OPERATIVE WITH PERITONEAL BIOPSIES AND FULGERATION OF ENDOMETRIOSIS;  Surgeon: [name], MD;  Location: [facility] ORS;  Service: Gynecology;  Laterality: N/A;  . NO PAST SURGERIES      Allergies Patient has no known allergies.  Social History Social History   Tobacco Use  . Smoking status: Never Smoker  . Smokeless tobacco: Never Used  Substance Use Topics  . Alcohol use: Yes    Comment: OCC  . Drug use: No   Review of Systems Constitutional: Negative for fever. Cardiovascular: Negative for chest pain. Respiratory: Negative for shortness of breath. Gastrointestinal: Positive for abdominal  pain Genitourinary: Positive for vaginal bleeding Musculoskeletal: Negative for back pain. Skin: Negative for rash. Neurological: Negative for headaches, focal weakness or numbness.  All systems negative/normal/unremarkable except as stated in the HPI  ____________________________________________   PHYSICAL EXAM:  VITAL SIGNS: ED Triage Vitals [10/15/17 0529]  Enc Vitals Group     BP 113/62     Pulse Rate 73     Resp 18     Temp 97.8 F (36.6 C)     Temp Source Oral     SpO2 99 %     Weight 132 lb (59.9 kg)     Height  (1.6 m)     Head Circumference      Peak Flow      Pain Score 10     Pain Loc      Pain Edu?      Excl. in GC?    Constitutional: Alert and oriented. Well appearing and in no distress. Eyes: Conjunctivae are normal. Normal extraocular movements. ENT   Head: Normocephalic and atraumatic.   Nose: No congestion/rhinnorhea.   Mouth/Throat: Mucous membranes are moist.   Neck: No stridor. Cardiovascular: Normal rate, regular rhythm. No murmurs, rubs, or gallops. Respiratory: Normal respiratory effort without tachypnea nor retractions. Breath sounds are clear and equal bilaterally. No wheezes/rales/rhonchi. Gastrointestinal: Left lower quadrant tenderness, no rebound or guarding.  Normal bowel sounds. Musculoskeletal: Nontender with normal range of motion in extremities. No lower extremity tenderness nor edema. Neurologic:  Normal speech and language. No gross focal neurologic deficits are appreciated.  Skin:  Skin is warm, dry and intact. No rash  noted. Psychiatric: Mood and affect are normal. Speech and behavior are normal.  ____________________________________________  ED COURSE:  As part of my medical decision making, I reviewed the following data within the electronic MEDICAL RECORD NUMBER History obtained from family if available, nursing notes, old chart and ekg, as well as notes from prior ED visits. Patient presented for abdominal pain,  we will assess with labs and imaging as indicated at this time.  EKG: Interpreted by me, normal sinus rhythm rate of 76 bpm, normal QRS, normal QT, normal axis.   Procedures ____________________________________________   LABS (pertinent positives/negatives)  Labs Reviewed  URINALYSIS, COMPLETE (UACMP) WITH MICROSCOPIC - Abnormal; Notable for the following components:      Result Value   Color, Urine YELLOW (*)    APPearance HAZY (*)    Hgb urine dipstick SMALL (*)    Bacteria, UA RARE (*)    All other components within normal limits  CBC  TROPONIN I  COMPREHENSIVE METABOLIC PANEL  LIPASE, BLOOD  POCT PREGNANCY, URINE    RADIOLOGY Chest x-ray IMPRESSION: No acute cardiopulmonary process seen.   ____________________________________________  DIFFERENTIAL DIAGNOSIS   Endometriosis, PID, ovarian cyst, chronic pain  FINAL ASSESSMENT AND PLAN  Pelvic pain, endometriosis   Plan: The patient had presented for pelvic pain with a history of endometriosis presenting similarly. Patient's labs are reassuring.  Patient was discussed with her OB/GYN doctor who had recently performed outpatient ultrasound and did not recommend further imaging.  She will be seen in the office in the next 48 hours for recheck.   Ulice Dash, MD   Note: This note was generated in part or whole with voice recognition software. Voice recognition is usually quite accurate but there are transcription errors that can and very often do occur. I apologize for any typographical errors that were not detected and corrected.     Emily Filbert, MD 10/15/17 0930    Emily Filbert, MD 10/25/17 1610    Emily Filbert, MD 10/25/17 [phone]

## 2018-02-11 ENCOUNTER — Emergency Department
Admission: EM | Admit: 2018-02-11 | Discharge: 2018-02-11 | Disposition: A | Discharge status: Home / Self Care | Payer: Managed Care, Other (non HMO) | Attending: Emergency Medicine | Admitting: Emergency Medicine

## 2018-02-11 ENCOUNTER — Encounter: Payer: Self-pay | Admitting: Emergency Medicine

## 2018-02-11 ENCOUNTER — Other Ambulatory Visit: Payer: Self-pay

## 2018-02-11 DIAGNOSIS — N390 Urinary tract infection, site not specified: Secondary | ICD-10-CM | POA: Diagnosis not present

## 2018-02-11 DIAGNOSIS — G8929 Other chronic pain: Secondary | ICD-10-CM

## 2018-02-11 DIAGNOSIS — Z79899 Other long term (current) drug therapy: Secondary | ICD-10-CM | POA: Insufficient documentation

## 2018-02-11 DIAGNOSIS — R102 Pelvic and perineal pain: Secondary | ICD-10-CM | POA: Insufficient documentation

## 2018-02-11 DIAGNOSIS — R109 Unspecified abdominal pain: Secondary | ICD-10-CM | POA: Diagnosis present

## 2018-02-11 HISTORY — DX: Pelvic and perineal pain: R10.2

## 2018-02-11 HISTORY — DX: Other chronic pain: G89.29

## 2018-02-11 LAB — URINALYSIS, COMPLETE (UACMP) WITH MICROSCOPIC
Bilirubin Urine: NEGATIVE
GLUCOSE, UA: NEGATIVE mg/dL
HGB URINE DIPSTICK: NEGATIVE
Ketones, ur: NEGATIVE mg/dL
NITRITE: POSITIVE — AB
PH: 5 (ref 5.0–8.0)
Protein, ur: NEGATIVE mg/dL
SPECIFIC GRAVITY, URINE: 1.023 (ref 1.005–1.030)

## 2018-02-11 LAB — COMPREHENSIVE METABOLIC PANEL
ALBUMIN: 4.1 g/dL (ref 3.5–5.0)
ALT: 13 U/L (ref 0–44)
AST: 19 U/L (ref 15–41)
Alkaline Phosphatase: 58 U/L (ref 38–126)
Anion gap: 7 (ref 5–15)
BUN: 16 mg/dL (ref 6–20)
CALCIUM: 9.2 mg/dL (ref 8.9–10.3)
CHLORIDE: 109 mmol/L (ref 98–111)
CO2: 23 mmol/L (ref 22–32)
CREATININE: 0.71 mg/dL (ref 0.44–1.00)
GFR calc Af Amer: >60 mL/min (ref >60)
GFR calc non Af Amer: >60 mL/min (ref >60)
Glucose, Bld: 95 mg/dL (ref 70–99)
POTASSIUM: 3.2 mmol/L — AB (ref 3.5–5.1)
Sodium: 139 mmol/L (ref 135–145)
Total Bilirubin: 0.7 mg/dL (ref 0.3–1.2)
Total Protein: 7.2 g/dL (ref 6.5–8.1)

## 2018-02-11 LAB — CBC
HCT: 41.3 % (ref 35.0–47.0)
Hemoglobin: 14.3 g/dL (ref 12.0–16.0)
MCH: 30.3 pg (ref 26.0–34.0)
MCHC: 34.7 g/dL (ref 32.0–36.0)
MCV: 87.2 fL (ref 80.0–100.0)
PLATELETS: 284 10*3/uL (ref 150–440)
RBC: 4.74 MIL/uL (ref 3.80–5.20)
RDW: 12.9 % (ref 11.5–14.5)
WBC: 12.8 10*3/uL — AB (ref 3.6–11.0)

## 2018-02-11 LAB — LIPASE, BLOOD: LIPASE: 41 U/L (ref 11–51)

## 2018-02-11 LAB — POCT PREGNANCY, URINE: PREG TEST UR: NEGATIVE

## 2018-02-11 LAB — HCG, QUANTITATIVE, PREGNANCY: hCG, Beta Chain, Quant, S: <1 m[IU]/mL (ref <5)

## 2018-02-11 MED ORDER — CEPHALEXIN 500 MG PO CAPS: 500.0000 mg | ORAL_CAPSULE | Freq: Two times a day (BID) | ORAL | 0 refills | Status: DC

## 2018-02-11 NOTE — ED Notes (Signed)
Patient had an IV from EMS but was infiltrated so removed and started new one for blood work.

## 2018-02-11 NOTE — ED Notes (Signed)
Notified lab of add-on hCG to previously collected blood sample. Lab tech verbalizes a sufficient amount of blood is available for testing and will begin processing at this time.

## 2018-02-11 NOTE — Discharge Instructions (Signed)
Your workup in the Emergency Department today was reassuring.  We did not find any specific abnormalities other than a mild urinary tract infection for which we have provided a prescription for antibiotics.  We recommend you drink plenty of fluids, take your regular medications and/or any new ones prescribed today, and follow up with the doctor(s) listed in these documents as recommended.  Return to the Emergency Department if you develop new or worsening symptoms that concern you.

## 2018-02-11 NOTE — ED Notes (Signed)
Informed pt of need for urine sample to perform UA and POC urine pregnancy test; pt denies ability to void at this time.

## 2018-02-11 NOTE — ED Triage Notes (Signed)
Patient coming into night for abd pain that started tonight but  has been having vaginal bleeding for 6 weeks but bleeding has decreased. Patient came in EMS and was given of fentanyl for pain. Patients has an appointment with [facility] on Thursday.

## 2018-02-11 NOTE — ED Notes (Signed)
Lab notified of add-on urine culture to previously collected urinalysis. Robin, lab tech, verbalizes acknowledgement and will begin processing at this time. 

## 2018-02-11 NOTE — ED Provider Notes (Signed)
[facility] Emergency Department Provider Note  ____________________________________________   First MD Initiated Contact with Patient 02/11/18 [phone]     (approximate)  I have reviewed the triage vital signs and the nursing notes.   HISTORY  Chief Complaint Abdominal Pain    HPI Heidi Lyons is a 22 y.o. female with medical history as listed below who has an extensive history of chronic pelvic pain for which she has been worked up by Dr. Dalbert Garnet and currently has an appointment in 2 days at the [facility] pelvic pain clinic.  She presents by EMS tonight for acute on chronic pelvic pain similar to her usual pain.  She reports that it is in the lower part of her abdomen, severe, nothing in particular makes it better or worse although the fentanyl she received by EMS did make the pain tolerable.  She has been on a variety of medications although she reports that she has stopped taking many of them including amitriptyline and gabapentin because they were not working.  She last saw Dr. Dalbert Garnet a few weeks ago and Dr. Dalbert Garnet set up the referral to [facility].  The patient denies fever/chills, chest pain or shortness of breath, nausea, vomiting, and dysuria.  She reports that her last menstrual.  LMP was about 2 weeks ago and this is common, to have the pain in the middle of her menstrual cycle.  Past Medical History:  Diagnosis Date  . Chronic pelvic pain in female   . Endometriosis     There are no active problems to display for this patient.   Past Surgical History:  Procedure Laterality Date  . CHROMOPERTUBATION Bilateral 02/08/2017   Procedure: CHROMOPERTUBATION;  Surgeon: [name], MD;  Location: [facility] ORS;  Service: Gynecology;  Laterality: Bilateral;  . LAPAROSCOPY N/A 02/08/2017   Procedure: LAPAROSCOPY OPERATIVE WITH PERITONEAL BIOPSIES AND FULGERATION OF ENDOMETRIOSIS;  Surgeon: [name], MD;  Location: [facility] ORS;  Service: Gynecology;  Laterality: N/A;    . NO PAST SURGERIES      Prior to Admission medications   Medication Sig Start Date End Date Taking? Authorizing Provider  cephALEXin (KEFLEX) 500 MG capsule Take 1 capsule (500 mg total) by mouth 2 (two) times daily. 02/11/18   Loleta Rose, MD  danazol (DANOCRINE) 100 MG capsule Take 100 mg by mouth 2 (two) times daily. 01/15/17   [provider]  docusate sodium (COLACE) 100 MG capsule Take 1 capsule (100 mg total) by mouth 2 (two) times daily. To keep stools soft 02/08/17   [name], MD  doxycycline (MONODOX) 100 MG capsule Take 100 mg by mouth 2 (two) times daily. 01/10/17   [provider]  etonogestrel (NEXPLANON) 68 MG IMPL implant 1 each by Subdermal route once.    [provider]  ibuprofen (ADVIL,MOTRIN) 800 MG tablet Take 1 tablet (800 mg total) by mouth every 8 (eight) hours as needed for moderate pain. 02/08/17   [name], MD  ketoconazole (NIZORAL) 2 % shampoo Apply 1 application topically daily as needed. For rash 01/03/17   [provider]  LYRICA 150 MG capsule Take 150 mg by mouth 2 (two) times daily. 01/14/17   [provider]  metroNIDAZOLE (FLAGYL) 500 MG tablet Take 500 mg by mouth 2 (two) times daily. 01/10/17   [provider]  ondansetron (ZOFRAN) 4 MG tablet Take 1 tablet (4 mg total) by mouth every 8 (eight) hours as needed for nausea or vomiting. 04/01/17   Jeanmarie Plant, MD  oxycodone (OXY-IR) 5 MG capsule Take 1 capsule (5 mg total) by mouth every 6 (six) hours as needed for pain. 04/01/17   Jeanmarie Plant, MD  oxyCODONE-acetaminophen (PERCOCET) 5-325 MG tablet Take 1-2 tablets by mouth every 8 (eight) hours as needed. 10/15/17   Emily Filbert, MD  oxyCODONE-acetaminophen (ROXICET) 5-325 MG tablet Take 1 tablet every 4 (four) hours as needed by mouth for severe pain. 04/09/17   Darci Current, MD    Allergies Patient has no known allergies.  No family history on file.  Social  History Social History   Tobacco Use  . Smoking status: Never Smoker  . Smokeless tobacco: Never Used  Substance Use Topics  . Alcohol use: Yes    Comment: OCC  . Drug use: No    Review of Systems Constitutional: No fever/chills Eyes: No visual changes. ENT: No sore throat. Cardiovascular: Denies chest pain. Respiratory: Denies shortness of breath. Gastrointestinal: Abdominal pain as described above with no nausea nor vomiting Genitourinary: Negative for dysuria. Musculoskeletal: Negative for neck pain.  Negative for back pain. Integumentary: Negative for rash. Neurological: Negative for headaches, focal weakness or numbness.   ____________________________________________   PHYSICAL EXAM:  VITAL SIGNS: ED Triage Vitals  Enc Vitals Group     BP 02/11/18 0128 (!) 170/138     Pulse Rate 02/11/18 0128 60     Resp 02/11/18 0128 18     Temp 02/11/18 0128 98.5 F (36.9 C)     Temp Source 02/11/18 0128 Oral     SpO2 02/11/18 0128 94 %     Weight 02/11/18 0130 64.9 kg (143 lb)     Height 02/11/18 0130 1.6 m (5\' 3" )     Head Circumference --      Peak Flow --      Pain Score 02/11/18 0145 10     Pain Loc --      Pain Edu? --      Excl. in GC? --     Constitutional: Alert and oriented. Well appearing and in no acute distress. Eyes: Conjunctivae are normal.  Head: Atraumatic. Nose: No congestion/rhinnorhea. Mouth/Throat: Mucous membranes are moist. Neck: No stridor.  No meningeal signs.   Cardiovascular: Normal rate, regular rhythm. Good peripheral circulation. Grossly normal heart sounds. Respiratory: Normal respiratory effort.  No retractions. Lungs CTAB. Gastrointestinal: Soft and nontender. No distention.  Musculoskeletal: No lower extremity tenderness nor edema. No gross deformities of extremities. Neurologic:  Normal speech and language. No gross focal neurologic deficits are appreciated.  Skin:  Skin is warm, dry and intact. No rash noted. Psychiatric: Mood  and affect are flat and blunted, but essentially normal.  ____________________________________________   LABS (all labs ordered are listed, but only abnormal results are displayed)  Labs Reviewed  COMPREHENSIVE METABOLIC PANEL - Abnormal; Notable for the following components:      Result Value   Potassium 3.2 (*)    All other components within normal limits  CBC - Abnormal; Notable for the following components:   WBC 12.8 (*)    All other components within normal limits  URINALYSIS, COMPLETE (UACMP) WITH MICROSCOPIC - Abnormal; Notable for the following components:   Color, Urine YELLOW (*)    APPearance CLOUDY (*)    Nitrite POSITIVE (*)    Leukocytes, UA MODERATE (*)    Bacteria, UA MANY (*)    Non Squamous Epithelial PRESENT (*)    All other components within normal limits  URINE CULTURE  LIPASE, BLOOD  HCG, QUANTITATIVE, PREGNANCY  POC URINE PREG, ED  POCT PREGNANCY, URINE   ____________________________________________  EKG  None - EKG not ordered by ED physician ____________________________________________  RADIOLOGY   ED MD interpretation: No imaging indicated tonight  Official radiology report(s): No results found.  ____________________________________________   PROCEDURES  Critical Care performed: No   Procedure(s) performed:   Procedures   ____________________________________________   INITIAL IMPRESSION / ASSESSMENT AND PLAN / ED COURSE  As part of my medical decision making, I reviewed the following data within the electronic MEDICAL RECORD NUMBER Nursing notes reviewed and incorporated, Labs reviewed , Old chart reviewed and Notes from prior ED visits    Differential diagnosis includes, but is not limited to, acute on chronic pelvic pain, mittelschmerz, ovarian torsion, STD/PID/TOA, appendicitis, biliary disease.  I reviewed the patient's medical record extensively.  She has had relatively recent testing for GC and chlamydia which were negative  and she has had exploratory laparoscopy and chromopertubation.  She has had imaging in the past which is all been reassuring.  She is currently in no distress with a nontender abdomen and symptoms consistent with her prior pelvic pain.  When I asked her what brought her in tonight, after going over her prior history to make sure I understood correctly, she said "I want to know why I have chronic pelvic pain".  I had an extensive discussion with the patient and her mother that it is very unlikely the emergency department will be able to give her a specific diagnosis for chronic pain.  Given that her pain is resolved, she has normal vital signs, a very mild leukocytosis of 12.8 which could be attributable to her positive urinary tract infection, negative pregnancy test, and essentially normal conference of metabolic panel other than a slightly low potassium, I encouraged her to follow-up in 2 days as planned at the [facility] pelvic pain clinic.  I do not feel there is any indication for additional work-up or imaging today.  She understands and agrees with the plan.  Given the chronic nature of her pain and the fact that she has prescriptions for narcotics already I will not provide any additional prescriptions today.     ____________________________________________  FINAL CLINICAL IMPRESSION(S) / ED DIAGNOSES  Final diagnoses:  Chronic pelvic pain in female  Urinary tract infection without hematuria, site unspecified     MEDICATIONS GIVEN DURING THIS VISIT:  Medications - No data to display   ED Discharge Orders         Ordered    cephALEXin (KEFLEX) 500 MG capsule  2 times daily     02/11/18 0420           Note:  This document was prepared using Dragon voice recognition software and may include unintentional dictation errors.    Loleta Rose, MD 02/11/18 [phone]

## 2018-02-13 ENCOUNTER — Encounter: Payer: Self-pay | Admitting: Physician Assistant

## 2018-02-13 ENCOUNTER — Ambulatory Visit: Payer: Managed Care, Other (non HMO) | Admitting: Physician Assistant

## 2018-02-13 VITALS — BP 100/60 | HR 78 | Temp 98.3°F | Resp 16 | Ht 63.0 in | Wt 143.8 lb

## 2018-02-13 DIAGNOSIS — F419 Anxiety disorder, unspecified: Secondary | ICD-10-CM | POA: Diagnosis not present

## 2018-02-13 DIAGNOSIS — N809 Endometriosis, unspecified: Secondary | ICD-10-CM

## 2018-02-13 DIAGNOSIS — F329 Major depressive disorder, single episode, unspecified: Secondary | ICD-10-CM | POA: Diagnosis not present

## 2018-02-13 LAB — URINE CULTURE: Special Requests: NORMAL

## 2018-02-13 MED ORDER — ESCITALOPRAM OXALATE 10 MG PO TABS: ORAL_TABLET | ORAL | 1 refills | Status: DC

## 2018-02-13 NOTE — Patient Instructions (Signed)
10 Relaxation Techniques That Zap Stress Fast By Jeannette Moninger   Listen  Relax. You deserve it, it's good for you, and it takes less time than you think. You don't need a spa weekend or a retreat. Each of these stress-relieving tips can get you from OMG to om in less than 15 minutes. 1. Meditate  A few minutes of practice per day can help ease anxiety. "Research suggests that daily meditation may alter the brain's neural pathways, making you more resilient to stress," says psychologist Robbie Maller Hartman, PhD, a [place] health and wellness coach. It's simple. Sit up straight with both feet on the floor. Close your eyes. Focus your attention on reciting -- out loud or silently -- a positive mantra such as "I feel at peace" or "I love myself." Place one hand on your belly to sync the mantra with your breaths. Let any distracting thoughts float by like clouds. 2. Breathe Deeply  Take a 5-minute break and focus on your breathing. Sit up straight, eyes closed, with a hand on your belly. Slowly inhale through your nose, feeling the breath start in your abdomen and work its way to the top of your head. Reverse the process as you exhale through your mouth.  "Deep breathing counters the effects of stress by slowing the heart rate and lowering blood pressure," psychologist Judith Tutin, PhD, says. She's a certified life coach in [place], [place] 3. Be Present  Slow down.  "Take 5 minutes and focus on only one behavior with awareness," Tutin says. Notice how the air feels on your face when you're walking and how your feet feel hitting the ground. Enjoy the texture and taste of each bite of food. When you spend time in the moment and focus on your senses, you should feel less tense. 4. Reach Out  Your social network is one of your best tools for handling stress. Talk to others -- preferably face to face, or at least on the phone. Share what's going on. You can get a fresh perspective while keeping your  connection strong. 5. Tune In to Your Body  Mentally scan your body to get a sense of how stress affects it each day. Lie on your back, or sit with your feet on the floor. Start at your toes and work your way up to your scalp, noticing how your body feels.  "Simply be aware of places you feel tight or loose without trying to change anything," Tutin says. For 1 to 2 minutes, imagine each deep breath flowing to that body part. Repeat this process as you move your focus up your body, paying close attention to sensations you feel in each body part. 6. Decompress  Place a warm heat wrap around your neck and shoulders for 10 minutes. Close your eyes and relax your face, neck, upper chest, and back muscles. Remove the wrap, and use a tennis ball or foam roller to massage away tension.  "Place the ball between your back and the wall. Lean into the ball, and hold gentle pressure for up to 15 seconds. Then move the ball to another spot, and apply pressure," says Cathy Benninger, a nurse practitioner and assistant professor at [employer] [facility] [employer] in [place]. 7. Laugh Out Loud  A good belly laugh doesn't just lighten the load mentally. It lowers cortisol, your body's stress hormone, and boosts brain chemicals called endorphins, which help your mood. Lighten up by tuning in to your favorite sitcom or video, reading   the comics, or chatting with someone who makes you smile. 8. Crank Up the Tunes  Research shows that listening to soothing music can lower blood pressure, heart rate, and anxiety. "Create a playlist of songs or nature sounds (the ocean, a bubbling brook, birds chirping), and allow your mind to focus on the different melodies, instruments, or singers in the piece," Benninger says. You also can blow off steam by rocking out to more upbeat tunes -- or singing at the top of your lungs! 9. Get Moving  You don't have to run in order to get a runner's high. All forms of exercise,  including yoga and walking, can ease depression and anxiety by helping the brain release feel-good chemicals and by giving your body a chance to practice dealing with stress. You can go for a quick walk around the block, take the stairs up and down a few flights, or do some stretching exercises like head rolls and shoulder shrugs. 10. Be Grateful  Keep a gratitude journal or several (one by your bed, one in your purse, and one at work) to help you remember all the things that are good in your life.  "Being grateful for your blessings cancels out negative thoughts and worries," says Joni Emmerling, a wellness coach in [place], [place].  Use these journals to savor good experiences like a child's smile, a sunshine-filled day, and good health. Don't forget to celebrate accomplishments like mastering a new task at work or a new hobby. When you start feeling stressed, spend a few minutes looking through your notes to remind yourself what really matters.   Escitalopram tablets What is this medicine? ESCITALOPRAM (es sye TAL oh pram) is used to treat depression and certain types of anxiety. This medicine may be used for other purposes; ask your health care provider or pharmacist if you have questions. COMMON BRAND NAME(S): Lexapro What should I tell my health care provider before I take this medicine? They need to know if you have any of these conditions: -bipolar disorder or a family history of bipolar disorder -diabetes -glaucoma -heart disease -kidney or liver disease -receiving electroconvulsive therapy -seizures (convulsions) -suicidal thoughts, plans, or attempt by you or a family member -an unusual or allergic reaction to escitalopram, the related drug citalopram, other medicines, foods, dyes, or preservatives -pregnant or trying to become pregnant -breast-feeding How should I use this medicine? Take this medicine by mouth with a glass of water. Follow the directions on the prescription  label. You can take it with or without food. If it upsets your stomach, take it with food. Take your medicine at regular intervals. Do not take it more often than directed. Do not stop taking this medicine suddenly except upon the advice of your doctor. Stopping this medicine too quickly may cause serious side effects or your condition may worsen. A special MedGuide will be given to you by the pharmacist with each prescription and refill. Be sure to read this information carefully each time. Talk to your pediatrician regarding the use of this medicine in children. Special care may be needed. Overdosage: If you think you have taken too much of this medicine contact a poison control center or emergency room at once. NOTE: This medicine is only for you. Do not share this medicine with others. What if I miss a dose? If you miss a dose, take it as soon as you can. If it is almost time for your next dose, take only that dose. Do not take double or   extra doses. What may interact with this medicine? Do not take this medicine with any of the following medications: -certain medicines for fungal infections like fluconazole, itraconazole, ketoconazole, posaconazole, voriconazole -cisapride -citalopram -dofetilide -dronedarone -linezolid -MAOIs like Carbex, Eldepryl, Marplan, Nardil, and Parnate -methylene blue (injected into a vein) -pimozide -thioridazine -ziprasidone This medicine may also interact with the following medications: -alcohol -amphetamines -aspirin and aspirin-like medicines -carbamazepine -certain medicines for depression, anxiety, or psychotic disturbances -certain medicines for migraine headache like almotriptan, eletriptan, frovatriptan, naratriptan, rizatriptan, sumatriptan, zolmitriptan -certain medicines for sleep -certain medicines that treat or prevent blood clots like warfarin, enoxaparin,  dalteparin -cimetidine -diuretics -fentanyl -furazolidone -isoniazid -lithium -metoprolol -NSAIDs, medicines for pain and inflammation, like ibuprofen or naproxen -other medicines that prolong the QT interval (cause an abnormal heart rhythm) -procarbazine -rasagiline -supplements like St. John's wort, kava kava, valerian -tramadol -tryptophan This list may not describe all possible interactions. Give your health care provider a list of all the medicines, herbs, non-prescription drugs, or dietary supplements you use. Also tell them if you smoke, drink alcohol, or use illegal drugs. Some items may interact with your medicine. What should I watch for while using this medicine? Tell your doctor if your symptoms do not get better or if they get worse. Visit your doctor or health care professional for regular checks on your progress. Because it may take several weeks to see the full effects of this medicine, it is important to continue your treatment as prescribed by your doctor. Patients and their families should watch out for new or worsening thoughts of suicide or depression. Also watch out for sudden changes in feelings such as feeling anxious, agitated, panicky, irritable, hostile, aggressive, impulsive, severely restless, overly excited and hyperactive, or not being able to sleep. If this happens, especially at the beginning of treatment or after a change in dose, call your health care professional. You may get drowsy or dizzy. Do not drive, use machinery, or do anything that needs mental alertness until you know how this medicine affects you. Do not stand or sit up quickly, especially if you are an older patient. This reduces the risk of dizzy or fainting spells. Alcohol may interfere with the effect of this medicine. Avoid alcoholic drinks. Your mouth may get dry. Chewing sugarless gum or sucking hard candy, and drinking plenty of water may help. Contact your doctor if the problem does not go  away or is severe. What side effects may I notice from receiving this medicine? Side effects that you should report to your doctor or health care professional as soon as possible: -allergic reactions like skin rash, itching or hives, swelling of the face, lips, or tongue -anxious -black, tarry stools -changes in vision -confusion -elevated mood, decreased need for sleep, racing thoughts, impulsive behavior -eye pain -fast, irregular heartbeat -feeling faint or lightheaded, falls -feeling agitated, angry, or irritable -hallucination, loss of contact with reality -loss of balance or coordination -loss of memory -painful or prolonged erections -restlessness, pacing, inability to keep still -seizures -stiff muscles -suicidal thoughts or other mood changes -trouble sleeping -unusual bleeding or bruising -unusually weak or tired -vomiting Side effects that usually do not require medical attention (report to your doctor or health care professional if they continue or are bothersome): -changes in appetite -change in sex drive or performance -headache -increased sweating -indigestion, nausea -tremors This list may not describe all possible side effects. Call your doctor for medical advice about side effects. You may report side effects to FDA at 1-800-FDA-1088.   Where should I keep my medicine? Keep out of reach of children. Store at room temperature between 15 and 30 degrees C (59 and 86 degrees F). Throw away any unused medicine after the expiration date. NOTE: This sheet is a summary. It may not cover all possible information. If you have questions about this medicine, talk to your doctor, pharmacist, or health care provider.  2018 Elsevier/Gold Standard (2015-10-24 13:20:23)   

## 2018-02-13 NOTE — Progress Notes (Signed)
Patient: Heidi Lyons, Female    DOB: [birth year], 22 y.o.   MRN: [id number] Visit Date: 02/13/2018  Today's Provider: Margaretann Loveless, PA-C   Chief Complaint  Patient presents with  . Establish Care  . New Patient (Initial Visit)   Subjective:    Establish Care: Heidi Lyons is a 22 y.o. female who presents today to establish care as new patient.  She reports having increased chest pressure episodes and feelings of SOB during those time periods.She is also having increased depression.   She also has acute complaint of LUQ pain as well. Reports this is intermittent. Does not radiate. No food triggers found. Reports to be using IBU 600mg  prn for GYN pain.    Patient is followed by [facility], Dr. Dalbert Garnet, for Endometriosis and chronic pelvic pain. Was recently referred to [facility]YN for further evaluation. Was seen today in that office. Reports having to start vaginal valium supp and pelvic floor therapy. Nexplanon was also discussed but she deferred at this time.   She is single. Sexually active but uses protection. Never smoked. Does drink alcohol excessively, reports 10 cans of beer and at least 2 shots of liquor per week. She does have family history of breast cancer and cervical cancer in her mother, living. Pancreatic cancer in a paternal uncle.  -----------------------------------------------------------------   Review of Systems  Constitutional: Positive for activity change and diaphoresis.  Eyes: Negative.   Respiratory: Negative.   Cardiovascular: Positive for chest pain ("on and off chest tightness").  Gastrointestinal: Positive for abdominal pain, constipation and vomiting.  Endocrine: Negative.   Genitourinary: Positive for difficulty urinating and vaginal bleeding.  Musculoskeletal: Positive for back pain and myalgias.  Skin: Negative.   Allergic/Immunologic: Negative.   Neurological: Negative.   Hematological: Bruises/bleeds easily.    Psychiatric/Behavioral: Positive for agitation and dysphoric mood. The patient is nervous/anxious.   All other systems reviewed and are negative.   Social History      She  reports that she has never smoked. She has never used smokeless tobacco. She reports that she drinks about 12.0 standard drinks of alcohol per week. She reports that she does not use drugs.       Social History   Socioeconomic History  . Marital status: Single    Spouse name: Not on file  . Number of children: Not on file  . Years of education: Not on file  . Highest education level: Not on file  Occupational History  . Not on file  Social Needs  . Financial resource strain: Not on file  . Food insecurity:    Worry: Not on file    Inability: Not on file  . Transportation needs:    Medical: Not on file    Non-medical: Not on file  Tobacco Use  . Smoking status: Never Smoker  . Smokeless tobacco: Never Used  Substance and Sexual Activity  . Alcohol use: Yes    Alcohol/week: 12.0 standard drinks    Types: 10 Cans of beer, 2 Shots of liquor per week  . Drug use: No  . Sexual activity: Yes  Lifestyle  . Physical activity:    Days per week: Not on file    Minutes per session: Not on file  . Stress: Not on file  Relationships  . Social connections:    Talks on phone: Not on file    Gets together: Not on file    Attends religious service: Not on file  Active member of club or organization: Not on file    Attends meetings of clubs or organizations: Not on file    Relationship status: Not on file  Other Topics Concern  . Not on file  Social History Narrative  . Not on file    Past Medical History:  Diagnosis Date  . Anxiety   . Chronic pelvic pain in female   . Depression   . Endometriosis      There are no active problems to display for this patient.   Past Surgical History:  Procedure Laterality Date  . CHROMOPERTUBATION Bilateral 02/08/2017   Procedure: CHROMOPERTUBATION;  Surgeon:  Christeen DouglasBeasley, Bethany, MD;  Location: [facility] ORS;  Service: Gynecology;  Laterality: Bilateral;  . LAPAROSCOPY N/A 02/08/2017   Procedure: LAPAROSCOPY OPERATIVE WITH PERITONEAL BIOPSIES AND FULGERATION OF ENDOMETRIOSIS;  Surgeon: Christeen DouglasBeasley, Bethany, MD;  Location: [facility] ORS;  Service: Gynecology;  Laterality: N/A;  . NO PAST SURGERIES      Family History        Family Status  Relation Name Status  . Mother  (Not Specified)  . Oneal GroutPat Uncle  (Not Specified)        Her family history includes Cancer in her mother and paternal uncle.      No Known Allergies   Current Outpatient Medications:  .  cephALEXin (KEFLEX) 500 MG capsule, Take 1 capsule (500 mg total) by mouth 2 (two) times daily., Disp: 14 capsule, Rfl: 0 .  etonogestrel (NEXPLANON) 68 MG IMPL implant, 1 each by Subdermal route once., Disp: , Rfl:  .  oxycodone (OXY-IR) 5 MG capsule, Take 1 capsule (5 mg total) by mouth every 6 (six) hours as needed for pain., Disp: 8 capsule, Rfl: 0 .  oxyCODONE-acetaminophen (PERCOCET) 5-325 MG tablet, Take 1-2 tablets by mouth every 8 (eight) hours as needed., Disp: 20 tablet, Rfl: 0 .  oxyCODONE-acetaminophen (ROXICET) 5-325 MG tablet, Take 1 tablet every 4 (four) hours as needed by mouth for severe pain., Disp: 6 tablet, Rfl: 0 .  danazol (DANOCRINE) 100 MG capsule, Take 100 mg by mouth 2 (two) times daily., Disp: , Rfl: 3 .  docusate sodium (COLACE) 100 MG capsule, Take 1 capsule (100 mg total) by mouth 2 (two) times daily. To keep stools soft (Patient not taking: Reported on 02/13/2018), Disp: 30 capsule, Rfl: 0 .  doxycycline (MONODOX) 100 MG capsule, Take 100 mg by mouth 2 (two) times daily., Disp: , Rfl: 0 .  ibuprofen (ADVIL,MOTRIN) 800 MG tablet, Take 1 tablet (800 mg total) by mouth every 8 (eight) hours as needed for moderate pain. (Patient not taking: Reported on 02/13/2018), Disp: 30 tablet, Rfl: 1 .  ketoconazole (NIZORAL) 2 % shampoo, Apply 1 application topically daily as needed. For rash, Disp: ,  Rfl: 11 .  LYRICA 150 MG capsule, Take 150 mg by mouth 2 (two) times daily., Disp: , Rfl: 3   Patient Care Team: Reine JustBurnette, Jennifer M, PA-C as PCP - General (Family Medicine)      Objective:   Vitals: BP 100/60 (BP Location: Right Arm, Patient Position: Sitting, Cuff Size: Normal)   Pulse 78   Temp 98.3 F (36.8 C) (Oral)   Resp 16   Ht 5\' 3"  (1.6 m)   Wt 143 lb 12.8 oz (65.2 kg)   LMP 01/02/2018 (Approximate)   BMI 25.47 kg/m    Vitals:   02/13/18 1447  BP: 100/60  Pulse: 78  Resp: 16  Temp: 98.3 F (36.8 C)  TempSrc: Oral  Weight: 143 lb 12.8 oz (65.2 kg)  Height: 5\' 3"  (1.6 m)     Physical Exam  Constitutional: She is oriented to person, place, and time. She appears well-developed and well-nourished. No distress.  HENT:  Head: Normocephalic and atraumatic.  Right Ear: External ear normal.  Left Ear: External ear normal.  Nose: Nose normal.  Mouth/Throat: Oropharynx is clear and moist. No oropharyngeal exudate.  Eyes: Pupils are equal, round, and reactive to light. Conjunctivae and EOM are normal. Right eye exhibits no discharge. Left eye exhibits no discharge. No scleral icterus.  Neck: Normal range of motion. Neck supple. No JVD present. No tracheal deviation present. No thyromegaly present.  Cardiovascular: Normal rate, regular rhythm, normal heart sounds and intact distal pulses. Exam reveals no gallop and no friction rub.  No murmur heard. Pulmonary/Chest: Effort normal and breath sounds normal. No respiratory distress. She has no wheezes. She has no rales. She exhibits no tenderness.  Abdominal: Soft. Bowel sounds are normal. She exhibits no distension and no mass. There is tenderness in the right lower quadrant, suprapubic area, left upper quadrant and left lower quadrant. There is no rebound and no guarding.  Musculoskeletal: Normal range of motion. She exhibits no edema or tenderness.  Lymphadenopathy:    She has no cervical adenopathy.  Neurological: She  is alert and oriented to person, place, and time.  Skin: Skin is warm and dry. No rash noted. She is not diaphoretic.  Psychiatric: She has a normal mood and affect. Her behavior is normal. Judgment and thought content normal.  Vitals reviewed.    Depression Screen PHQ 2/9 Scores 02/13/2018  PHQ - 2 Score 4  PHQ- 9 Score 10      Assessment & Plan:     Routine Health Maintenance and Physical Exam  Exercise Activities and Dietary recommendations Goals   None      There is no immunization history on file for this patient.  Health Maintenance  Topic Date Due  . HIV Screening  09/16/2011  . TETANUS/TDAP  09/16/2015  . PAP SMEAR  09/15/2017  . INFLUENZA VACCINE  01/02/2018     Discussed health benefits of physical activity, and encouraged her to engage in regular exercise appropriate for her age and condition.    1. Anxiety and depression Worsening with higher PHQ9 of 10. Will start lexapro as below. I will f/u in 4-6 weeks to see how she is doing. Call if complications in the meantime.  - escitalopram (LEXAPRO) 10 MG tablet; Take 1/2 tab PO q hs x 1 week then increase to 1 tab PO q hs  Dispense: 30 tablet; Refill: 1  2. Endometriosis Followed by [facility]ian[facility] Fick, Dr. Dalbert Garnet and [facility] pelvic floor.   --------------------------------------------------------------------    Margaretann Loveless, PA-C  [facility]

## 2018-03-14 ENCOUNTER — Ambulatory Visit: Payer: Self-pay | Admitting: Physician Assistant

## 2018-05-21 ENCOUNTER — Encounter: Payer: Self-pay | Admitting: Physician Assistant

## 2018-05-21 ENCOUNTER — Ambulatory Visit: Payer: Managed Care, Other (non HMO) | Admitting: Physician Assistant

## 2018-05-21 VITALS — BP 108/72 | HR 90 | Temp 98.1°F | Resp 16 | Wt 141.0 lb

## 2018-05-21 DIAGNOSIS — J0140 Acute pansinusitis, unspecified: Secondary | ICD-10-CM

## 2018-05-21 DIAGNOSIS — H6503 Acute serous otitis media, bilateral: Secondary | ICD-10-CM

## 2018-05-21 DIAGNOSIS — J029 Acute pharyngitis, unspecified: Secondary | ICD-10-CM | POA: Diagnosis not present

## 2018-05-21 LAB — POCT RAPID STREP A (OFFICE): Rapid Strep A Screen: NEGATIVE

## 2018-05-21 MED ORDER — AMOXICILLIN-POT CLAVULANATE 875-125 MG PO TABS: 1.0000 | ORAL_TABLET | Freq: Two times a day (BID) | ORAL | 0 refills | Status: DC

## 2018-05-21 MED ORDER — PREDNISONE 10 MG (21) PO TBPK: ORAL_TABLET | ORAL | 0 refills | Status: DC

## 2018-05-21 NOTE — Patient Instructions (Signed)
Otitis Media, Adult    Otitis media occurs when there is inflammation and fluid in the middle ear. Your middle ear is a part of the ear that contains bones for hearing as well as air that helps send sounds to your brain.  What are the causes?  This condition is caused by a blockage in the eustachian tube. This tube drains fluid from the ear to the back of the nose (nasopharynx). A blockage in this tube can be caused by an object or by swelling (edema) in the tube. Problems that can cause a blockage include:   A cold or other upper respiratory infection.   Allergies.   An irritant, such as tobacco smoke.   Enlarged adenoids. The adenoids are areas of soft tissue located high in the back of the throat, behind the nose and the roof of the mouth.   A mass in the nasopharynx.   Damage to the ear caused by pressure changes (barotrauma).  What are the signs or symptoms?  Symptoms of this condition include:   Ear pain.   A fever.   Decreased hearing.   A headache.   Tiredness (lethargy).   Fluid leaking from the ear.   Ringing in the ear.  How is this diagnosed?  This condition is diagnosed with a physical exam. During the exam your health care provider will use an instrument called an otoscope to look into your ear and check for redness, swelling, and fluid. He or she will also ask about your symptoms.  Your health care provider may also order tests, such as:   A test to check the movement of the eardrum (pneumatic otoscopy). This test is done by squeezing a small amount of air into the ear.   A test that changes air pressure in the middle ear to check how well the eardrum moves and whether the eustachian tube is working (tympanogram).  How is this treated?  This condition usually goes away on its own within 3-5 days. But if the condition is caused by a bacteria infection and does not go away own its own, or keeps coming back, your health care provider may:   Prescribe antibiotic medicines to treat the  infection.   Prescribe or recommend medicines to control pain.  Follow these instructions at home:   Take over-the-counter and prescription medicines only as told by your health care provider.   If you were prescribed an antibiotic medicine, take it as told by your health care provider. Do not stop taking the antibiotic even if you start to feel better.   Keep all follow-up visits as told by your health care provider. This is important.  Contact a health care provider if:   You have bleeding from your nose.   There is a lump on your neck.   You are not getting better in 5 days.   You feel worse instead of better.  Get help right away if:   You have severe pain that is not controlled with medicine.   You have swelling, redness, or pain around your ear.   You have stiffness in your neck.   A part of your face is paralyzed.   The bone behind your ear (mastoid) is tender when you touch it.   You develop a severe headache.  Summary   Otitis media is redness, soreness, and swelling of the middle ear.   This condition usually goes away on its own within 3-5 days.   If the problem does   not go away in 3-5 days, your health care provider may prescribe or recommend medicines to treat your symptoms.   If you were prescribed an antibiotic medicine, take it as told by your health care provider.  This information is not intended to replace advice given to you by your health care provider. Make sure you discuss any questions you have with your health care provider.  Document Released: 02/24/2004 Document Revised: 05/11/2016 Document Reviewed: 05/11/2016  Elsevier Interactive Patient Education  2019 Else[employer] Inc[employer]        Sinusitis, Adult  Sinusitis is inflammation of your sinuses. Sinuses are hollow spaces in the bones around your face. Your sinuses are located:   Around your eyes.   In the middle of your forehead.   Behind your nose.   In your cheekbones.  Mucus normally drains out of your sinuses. When your  nasal tissues become inflamed or swollen, mucus can become trapped or blocked. This allows bacteria, viruses, and fungi to grow, which leads to infection. Most infections of the sinuses are caused by a virus.  Sinusitis can develop quickly. It can last for up to 4 weeks (acute) or for more than 12 weeks (chronic). Sinusitis often develops after a cold.  What are the causes?  This condition is caused by anything that creates swelling in the sinuses or stops mucus from draining. This includes:   Allergies.   Asthma.   Infection from bacteria or viruses.   Deformities or blockages in your nose or sinuses.   Abnormal growths in the nose (nasal polyps).   Pollutants, such as chemicals or irritants in the air.   Infection from fungi (rare).  What increases the risk?  You are more likely to develop this condition if you:   Have a weak body defense system (immune system).   Do a lot of swimming or diving.   Overuse nasal sprays.   Smoke.  What are the signs or symptoms?  The main symptoms of this condition are pain and a feeling of pressure around the affected sinuses. Other symptoms include:   Stuffy nose or congestion.   Thick drainage from your nose.   Swelling and warmth over the affected sinuses.   Headache.   Upper toothache.   A cough that may get worse at night.   Extra mucus that collects in the throat or the back of the nose (postnasal drip).   Decreased sense of smell and taste.   Fatigue.   A fever.   Sore throat.   Bad breath.  How is this diagnosed?  This condition is diagnosed based on:   Your symptoms.   Your medical history.   A physical exam.   Tests to find out if your condition is acute or chronic. This may include:  ? Checking your nose for nasal polyps.  ? Viewing your sinuses using a device that has a light (endoscope).  ? Testing for allergies or bacteria.  ? Imaging tests, such as an MRI or CT scan.  In rare cases, a bone biopsy may be done to rule out more serious types  of fungal sinus disease.  How is this treated?  Treatment for sinusitis depends on the cause and whether your condition is chronic or acute.   If caused by a virus, your symptoms should go away on their own within 10 days. You may be given medicines to relieve symptoms. They include:  ? Medicines that shrink swollen nasal passages (topical intranasal decongestants).  ? Medicines   that treat allergies (antihistamines).  ? A spray that eases inflammation of the nostrils (topical intranasal corticosteroids).  ? Rinses that help get rid of thick mucus in your nose (nasal saline washes).   If caused by bacteria, your health care provider may recommend waiting to see if your symptoms improve. Most bacterial infections will get better without antibiotic medicine. You may be given antibiotics if you have:  ? A severe infection.  ? A weak immune system.   If caused by narrow nasal passages or nasal polyps, you may need to have surgery.  Follow these instructions at home:  Medicines   Take, use, or apply over-the-counter and prescription medicines only as told by your health care provider. These may include nasal sprays.   If you were prescribed an antibiotic medicine, take it as told by your health care provider. Do not stop taking the antibiotic even if you start to feel better.  Hydrate and humidify     Drink enough fluid to keep your urine pale yellow. Staying hydrated will help to thin your mucus.   Use a cool mist humidifier to keep the humidity level in your home above 50%.   Inhale steam for 10-15 minutes, 3-4 times a day, or as told by your health care provider. You can do this in the bathroom while a hot shower is running.   Limit your exposure to cool or dry air.  Rest   Rest as much as possible.   Sleep with your head raised (elevated).   Make sure you get enough sleep each night.  General instructions     Apply a warm, moist washcloth to your face 3-4 times a day or as told by your health care  provider. This will help with discomfort.   Wash your hands often with soap and water to reduce your exposure to germs. If soap and water are not available, use hand sanitizer.   Do not smoke. Avoid being around people who are smoking (secondhand smoke).   Keep all follow-up visits as told by your health care provider. This is important.  Contact a health care provider if:   You have a fever.   Your symptoms get worse.   Your symptoms do not improve within 10 days.  Get help right away if:   You have a severe headache.   You have persistent vomiting.   You have severe pain or swelling around your face or eyes.   You have vision problems.   You develop confusion.   Your neck is stiff.   You have trouble breathing.  Summary   Sinusitis is soreness and inflammation of your sinuses. Sinuses are hollow spaces in the bones around your face.   This condition is caused by nasal tissues that become inflamed or swollen. The swelling traps or blocks the flow of mucus. This allows bacteria, viruses, and fungi to grow, which leads to infection.   If you were prescribed an antibiotic medicine, take it as told by your health care provider. Do not stop taking the antibiotic even if you start to feel better.   Keep all follow-up visits as told by your health care provider. This is important.  This information is not intended to replace advice given to you by your health care provider. Make sure you discuss any questions you have with your health care provider.  Document Released: 05/21/2005 Document Revised: 10/21/2017 Document Reviewed: 10/21/2017  Elsevier Interactive Patient Education  2019 Elsevier Inc[employer]

## 2018-05-21 NOTE — Progress Notes (Signed)
Patient: Heidi Lyons Female    DOB: Apr 12, 1996   22 y.o.   MRN: [id number] Visit Date: 05/21/2018  Today's Provider: Margaretann Loveless, PA-C   Chief Complaint  Patient presents with  . URI   Subjective:     HPI Patient here today c/o sore throat since Monday and reports fever 104.6 on Sunday, no other fevers since. Patient reports cough, runny nose and ears are popping. Patient reports no appetite. Patient reports shortness of breath and wheezing. Patient reports lower back pain and neck pain. Patient reports taking Nyquil last time she took it was last night. No other sick contacts.   No Known Allergies   Current Outpatient Medications:  .  escitalopram (LEXAPRO) 10 MG tablet, Take 1/2 tab PO q hs x 1 week then increase to 1 tab PO q hs, Disp: 30 tablet, Rfl: 1 .  etonogestrel (NEXPLANON) 68 MG IMPL implant, 1 each by Subdermal route once., Disp: , Rfl:   Review of Systems  Constitutional: Negative.   HENT: Positive for congestion, ear pain, postnasal drip, rhinorrhea, sinus pain and sore throat.   Respiratory: Positive for cough, shortness of breath and wheezing.   Cardiovascular: Negative.   Gastrointestinal: Negative for abdominal pain.  Musculoskeletal: Positive for back pain and neck pain.  Neurological: Positive for headaches. Negative for dizziness.    Social History   Tobacco Use  . Smoking status: Never Smoker  . Smokeless tobacco: Never Used  Substance Use Topics  . Alcohol use: Yes    Alcohol/week: 12.0 standard drinks    Types: 10 Cans of beer, 2 Shots of liquor per week      Objective:   BP 108/72 (BP Location: Right Arm, Patient Position: Sitting, Cuff Size: Normal)   Pulse 90   Temp 98.1 F (36.7 C) (Oral)   Resp 16   Wt 141 lb (64 kg)   SpO2 98%   BMI 24.98 kg/m  Vitals:   05/21/18 1217  BP: 108/72  Pulse: 90  Resp: 16  Temp: 98.1 F (36.7 C)  TempSrc: Oral  SpO2: 98%  Weight: 141 lb (64 kg)     Physical Exam Vitals  signs reviewed.  Constitutional:      General: She is not in acute distress.    Appearance: She is well-developed and normal weight. She is ill-appearing. She is not diaphoretic.  HENT:     Head: Normocephalic and atraumatic.     Right Ear: Hearing, ear canal and external ear normal. A middle ear effusion is present. Tympanic membrane is erythematous and bulging.     Left Ear: Hearing, ear canal and external ear normal. A middle ear effusion is present. Tympanic membrane is erythematous and bulging.     Nose: Mucosal edema present.     Left Turbinates: Swollen.     Right Sinus: Maxillary sinus tenderness and frontal sinus tenderness present.     Left Sinus: Maxillary sinus tenderness and frontal sinus tenderness present.     Mouth/Throat:     Pharynx: Uvula midline. No oropharyngeal exudate.  Neck:     Musculoskeletal: Normal range of motion and neck supple.     Thyroid: No thyromegaly.     Trachea: No tracheal deviation.  Cardiovascular:     Rate and Rhythm: Normal rate and regular rhythm.     Heart sounds: Normal heart sounds. No murmur. No friction rub. No gallop.   Pulmonary:     Effort: Pulmonary effort is normal. No  respiratory distress.     Breath sounds: Normal breath sounds. No stridor. No wheezing or rales.  Lymphadenopathy:     Cervical: No cervical adenopathy.  Neurological:     Mental Status: She is alert.         Assessment & Plan    1. Acute non-recurrent pansinusitis Worsening symptoms that have not responded to OTC medications. Will give augmentin as below. Prednisone for inflammation. Continue allergy medications. Stay well hydrated and get plenty of rest. Call if no symptom improvement or if symptoms worsen. - amoxicillin-clavulanate (AUGMENTIN) 875-125 MG tablet; Take 1 tablet by mouth 2 (two) times daily.  Dispense: 20 tablet; Refill: 0 - predniSONE (STERAPRED UNI-PAK 21 TAB) 10 MG (21) TBPK tablet; 6 day taper; take as directed on package instructions   Dispense: 21 tablet; Refill: 0  2. Non-recurrent acute serous otitis media of both ears See above medical treatment plan. - amoxicillin-clavulanate (AUGMENTIN) 875-125 MG tablet; Take 1 tablet by mouth 2 (two) times daily.  Dispense: 20 tablet; Refill: 0 - predniSONE (STERAPRED UNI-PAK 21 TAB) 10 MG (21) TBPK tablet; 6 day taper; take as directed on package instructions  Dispense: 21 tablet; Refill: 0  3. Sore throat Strep negative.  - POCT rapid strep A     Margaretann LovelessJennifer M , PA-C  [facility]

## 2018-07-23 ENCOUNTER — Ambulatory Visit
Admission: RE | Admit: 2018-07-23 | Discharge: 2018-07-23 | Disposition: A | Discharge status: Home / Self Care | Payer: Managed Care, Other (non HMO) | Source: Ambulatory Visit | Attending: Physician Assistant | Admitting: Physician Assistant

## 2018-07-23 ENCOUNTER — Encounter: Payer: Self-pay | Admitting: Physician Assistant

## 2018-07-23 ENCOUNTER — Ambulatory Visit: Payer: Managed Care, Other (non HMO) | Admitting: Physician Assistant

## 2018-07-23 ENCOUNTER — Ambulatory Visit
Admission: RE | Admit: 2018-07-23 | Discharge: 2018-07-23 | Disposition: A | Discharge status: Home / Self Care | Payer: Managed Care, Other (non HMO) | Attending: Physician Assistant | Admitting: Physician Assistant

## 2018-07-23 VITALS — BP 131/73 | HR 96 | Temp 98.4°F | Resp 16 | Wt 147.2 lb

## 2018-07-23 DIAGNOSIS — Z136 Encounter for screening for cardiovascular disorders: Secondary | ICD-10-CM

## 2018-07-23 DIAGNOSIS — K921 Melena: Secondary | ICD-10-CM | POA: Insufficient documentation

## 2018-07-23 DIAGNOSIS — R109 Unspecified abdominal pain: Secondary | ICD-10-CM | POA: Insufficient documentation

## 2018-07-23 DIAGNOSIS — R1032 Left lower quadrant pain: Secondary | ICD-10-CM

## 2018-07-23 DIAGNOSIS — K5904 Chronic idiopathic constipation: Secondary | ICD-10-CM

## 2018-07-23 DIAGNOSIS — K602 Anal fissure, unspecified: Secondary | ICD-10-CM

## 2018-07-23 DIAGNOSIS — Z13220 Encounter for screening for lipoid disorders: Secondary | ICD-10-CM

## 2018-07-23 MED ORDER — POLYETHYLENE GLYCOL 3350 17 GM/SCOOP PO POWD: 17.0000 g | Freq: Two times a day (BID) | ORAL | 1 refills | Status: DC | PRN

## 2018-07-23 MED ORDER — HYDROCORTISONE 2.5 % RE CREA: 1.0000 "application " | TOPICAL_CREAM | Freq: Two times a day (BID) | RECTAL | 0 refills | Status: DC

## 2018-07-23 NOTE — Progress Notes (Signed)
Patient: Heidi Lyons Female    DOB: 01/14/1996   23 y.o.   MRN: [id number] Visit Date: 07/23/2018  Today's Provider: Margaretann Loveless, PA-C   Chief Complaint  Patient presents with  . Blood In Stools   Subjective:     HPI  Blood in Stool: Patient presents for presents evaluation of blood in stool/ rectal bleeding. Patient has associated symptoms of abdominal pain and constipation. She reports that she gets constipated.The patient denies diarrhea and loose stools.  The patient has a known history of: family history of colon cancer from a paternal relative. No family history of IBD (Crohn's or UC). The patient has had about 10-15 episodes of rectal bleeding.  Reports that yesterday was the first time she had a clot of blood. Reports she has been seeing red blood in the tissue paper when she wipes for the past two months.She reports she normally does not look at her stools, but did yesterday and saw the blood in the toilet as well as on the tissue paper, which is different than normal. There is not a history of rectal injury, no anal penetration. Patient has had similar episodes of rectal bleeding in the past.   No Known Allergies   Current Outpatient Medications:  .  etonogestrel (NEXPLANON) 68 MG IMPL implant, 1 each by Subdermal route once., Disp: , Rfl:  .  escitalopram (LEXAPRO) 10 MG tablet, Take 1/2 tab PO q hs x 1 week then increase to 1 tab PO q hs, Disp: 30 tablet, Rfl: 1  Review of Systems  Constitutional: Negative.   Respiratory: Negative.   Cardiovascular: Negative.   Gastrointestinal: Positive for abdominal distention, blood in stool, constipation and rectal pain. Negative for abdominal pain, nausea and vomiting.  Genitourinary: Negative.   Neurological: Negative.     Social History   Tobacco Use  . Smoking status: Never Smoker  . Smokeless tobacco: Never Used  Substance Use Topics  . Alcohol use: Yes    Alcohol/week: 12.0 standard drinks    Types:  10 Cans of beer, 2 Shots of liquor per week      Objective:   BP 131/73 (BP Location: Left Arm, Patient Position: Sitting, Cuff Size: Normal)   Pulse 96   Temp 98.4 F (36.9 C) (Oral)   Resp 16   Wt 147 lb 3.2 oz (66.8 kg)   BMI 26.08 kg/m  Vitals:   07/23/18 1202  BP: 131/73  Pulse: 96  Resp: 16  Temp: 98.4 F (36.9 C)  TempSrc: Oral  Weight: 147 lb 3.2 oz (66.8 kg)     Physical Exam Constitutional:      General: She is not in acute distress.    Appearance: Normal appearance. She is well-developed. She is not diaphoretic.  Cardiovascular:     Rate and Rhythm: Normal rate and regular rhythm.     Heart sounds: Normal heart sounds. No murmur. No friction rub. No gallop.   Pulmonary:     Effort: Pulmonary effort is normal. No respiratory distress.     Breath sounds: Normal breath sounds. No wheezing or rales.  Abdominal:     General: Bowel sounds are normal. There is no distension.     Palpations: Abdomen is soft. There is no mass.     Tenderness: There is no abdominal tenderness. There is no guarding or rebound.  Genitourinary:    Rectum: Tenderness and anal fissure present. No mass, external hemorrhoid or internal hemorrhoid. Normal anal tone.  Skin:    General: Skin is warm and dry.  Neurological:     Mental Status: She is alert and oriented to person, place, and time.         Assessment & Plan    1. Left lower quadrant abdominal pain Will get imaging to r/o other abnormality and confirm constipation. I will check labs as below to make sure she is not anemic from the chronic blood loss and r/o other organic causes for worsening constipation. If all normal, I do suspect constipation with anal fissure as the cause. I will use anusol suppositories as below for vasoconstriction and hopefully facilitate healing of the anal fissure. Increase dietary fiber. Also advised patient to start miralax 1 capful daily and adjust up or down as needed until a soft BM  consistency is achieved, not loose/watery. May also need to add on a stool softener as well. Call if no improvements or worsening symptoms.  - DG Abd 2 Views; Future - CBC w/Diff/Platelet - Comprehensive Metabolic Panel (CMET) - TSH  2. Blood in stool See above medical treatment plan. - DG Abd 2 Views; Future - CBC w/Diff/Platelet - Comprehensive Metabolic Panel (CMET) - TSH  3. Encounter for lipid screening for cardiovascular disease Will check labs as below and f/u pending results. - Lipid Profile  4. Anal fissure See above medical treatment plan for #1.  - hydrocortisone (ANUSOL-HC) 2.5 % rectal cream; Place 1 application rectally 2 (two) times daily.  Dispense: 30 g; Refill: 0  5. Chronic idiopathic constipation See above medical treatment plan for #1.  - polyethylene glycol powder (GLYCOLAX/MIRALAX) powder; Take 17 g by mouth 2 (two) times daily as needed.  Dispense: 3350 g; Refill: 1     Margaretann Loveless, PA-C  [facility]

## 2018-07-23 NOTE — Patient Instructions (Signed)
Benefiber or Metamucil for adding fibers Miralax one capful daily to start Stool softener is also an option to soften BM Push fluids  Constipation, Adult Constipation is when a person has fewer bowel movements in a week than normal, has difficulty having a bowel movement, or has stools that are dry, hard, or larger than normal. Constipation may be caused by an underlying condition. It may become worse with age if a person takes certain medicines and does not take in enough fluids. Follow these instructions at home: Eating and drinking   Eat foods that have a lot of fiber, such as fresh fruits and vegetables, whole grains, and beans.  Limit foods that are high in fat, low in fiber, or overly processed, such as french fries, hamburgers, cookies, candies, and soda.  Drink enough fluid to keep your urine clear or pale yellow. General instructions  Exercise regularly or as told by your health care provider.  Go to the restroom when you have the urge to go. Do not hold it in.  Take over-the-counter and prescription medicines only as told by your health care provider. These include any fiber supplements.  Practice pelvic floor retraining exercises, such as deep breathing while relaxing the lower abdomen and pelvic floor relaxation during bowel movements.  Watch your condition for any changes.  Keep all follow-up visits as told by your health care provider. This is important. Contact a health care provider if:  You have pain that gets worse.  You have a fever.  You do not have a bowel movement after 4 days.  You vomit.  You are not hungry.  You lose weight.  You are bleeding from the anus.  You have thin, pencil-like stools. Get help right away if:  You have a fever and your symptoms suddenly get worse.  You leak stool or have blood in your stool.  Your abdomen is bloated.  You have severe pain in your abdomen.  You feel dizzy or you faint. This information is not  intended to replace advice given to you by your health care provider. Make sure you discuss any questions you have with your health care provider. Document Released: 02/17/2004 Document Revised: 12/09/2015 Document Reviewed: 11/09/2015 Elsevier Interactive Patient Education  2019 [employer].

## 2018-07-24 LAB — CBC WITH DIFFERENTIAL/PLATELET
BASOS: 1 %
Basophils Absolute: 0.1 10*3/uL (ref 0.0–0.2)
EOS (ABSOLUTE): 0.2 10*3/uL (ref 0.0–0.4)
EOS: 3 %
HEMATOCRIT: 41.8 % (ref 34.0–46.6)
HEMOGLOBIN: 14.2 g/dL (ref 11.1–15.9)
Immature Grans (Abs): 0 10*3/uL (ref 0.0–0.1)
Immature Granulocytes: 0 %
LYMPHS ABS: 1.9 10*3/uL (ref 0.7–3.1)
Lymphs: 28 %
MCH: 29.4 pg (ref 26.6–33.0)
MCHC: 34 g/dL (ref 31.5–35.7)
MCV: 87 fL (ref 79–97)
MONOCYTES: 8 %
Monocytes Absolute: 0.5 10*3/uL (ref 0.1–0.9)
NEUTROS ABS: 4.2 10*3/uL (ref 1.4–7.0)
Neutrophils: 60 %
Platelets: 326 10*3/uL (ref 150–450)
RBC: 4.83 x10E6/uL (ref 3.77–5.28)
RDW: 12.2 % (ref 11.7–15.4)
WBC: 6.9 10*3/uL (ref 3.4–10.8)

## 2018-07-24 LAB — COMPREHENSIVE METABOLIC PANEL
ALBUMIN: 5 g/dL (ref 3.9–5.0)
ALK PHOS: 63 IU/L (ref 39–117)
ALT: 14 IU/L (ref 0–32)
AST: 20 IU/L (ref 0–40)
Albumin/Globulin Ratio: 2 (ref 1.2–2.2)
BUN / CREAT RATIO: 13 (ref 9–23)
BUN: 11 mg/dL (ref 6–20)
Bilirubin Total: 0.6 mg/dL (ref 0.0–1.2)
CO2: 19 mmol/L — AB (ref 20–29)
CREATININE: 0.86 mg/dL (ref 0.57–1.00)
Calcium: 9.7 mg/dL (ref 8.7–10.2)
Chloride: 104 mmol/L (ref 96–106)
GFR calc Af Amer: 112 mL/min/{1.73_m2} (ref >59)
GFR, EST NON AFRICAN AMERICAN: 97 mL/min/{1.73_m2} (ref >59)
GLOBULIN, TOTAL: 2.5 g/dL (ref 1.5–4.5)
Glucose: 83 mg/dL (ref 65–99)
Potassium: 4.1 mmol/L (ref 3.5–5.2)
SODIUM: 141 mmol/L (ref 134–144)
Total Protein: 7.5 g/dL (ref 6.0–8.5)

## 2018-07-24 LAB — LIPID PANEL
CHOL/HDL RATIO: 2.3 ratio (ref 0.0–4.4)
Cholesterol, Total: 141 mg/dL (ref 100–199)
HDL: 61 mg/dL (ref >39)
LDL CALC: 63 mg/dL (ref 0–99)
Triglycerides: 83 mg/dL (ref 0–149)
VLDL CHOLESTEROL CAL: 17 mg/dL (ref 5–40)

## 2018-07-24 LAB — TSH: TSH: 2.99 u[IU]/mL (ref 0.450–4.500)

## 2018-07-25 ENCOUNTER — Telehealth: Payer: Self-pay

## 2018-07-25 NOTE — Telephone Encounter (Signed)
Patient was advised.  

## 2018-07-25 NOTE — Telephone Encounter (Signed)
-----   Message from Margaretann Loveless, [place] sent at 07/25/2018  2:04 PM EST ----- Moderate stool burden. Suspect constipation as cause of the issue discussed in the office. Use stool softener and/or miralax to keep BM soft.

## 2018-07-25 NOTE — Telephone Encounter (Signed)
-----   Message from Margaretann Loveless, [place] sent at 07/25/2018  3:35 PM EST ----- Blood count is normal. Kidney and liver function normal. Sugar normal. Thyroid normal. Cholesterol normal.

## 2018-07-25 NOTE — Telephone Encounter (Signed)
LMTCB

## 2018-07-28 NOTE — Telephone Encounter (Signed)
lmtcb

## 2018-07-28 NOTE — Telephone Encounter (Signed)
Patient advised as below. Patient advised of x-ray result also. sd

## 2018-09-03 ENCOUNTER — Telehealth: Payer: Self-pay | Admitting: *Deleted

## 2018-09-03 NOTE — Telephone Encounter (Signed)
She can start OTC allergy eye drops. I personally use Visine multi-symptom relief for redness, itching and drainage. There is also allergy specific.   Also using warm compresses for inflammation. Try conservative treatments x 2-3 days.  If symptoms continue to worsen she can do an evisit for possible conjunctivitis.

## 2018-09-03 NOTE — Telephone Encounter (Signed)
Left message for patient to call.

## 2018-09-03 NOTE — Telephone Encounter (Signed)
Spoke to patient regarding possible treatment methods. Told to call back if symptoms do not resolve in the next 2 to 3 days to schedule an e-visit.

## 2018-09-03 NOTE — Telephone Encounter (Signed)
Patient's mom Heidi Lyons called office stating pt has had allergy symptoms for 3 weeks. Symptoms include runny nose, sneezing, slight cough. Heidi Lyons is more concerned with patient's eyes which she says look yellow and red. Heidi Lyons would like Heidi Lyons's opinion on whether pt should be concerned about her eyes. PN#[phone]. Please advise?

## 2018-09-04 ENCOUNTER — Ambulatory Visit (INDEPENDENT_AMBULATORY_CARE_PROVIDER_SITE_OTHER): Payer: Managed Care, Other (non HMO) | Admitting: Physician Assistant

## 2018-09-04 DIAGNOSIS — H1133 Conjunctival hemorrhage, bilateral: Secondary | ICD-10-CM | POA: Diagnosis not present

## 2018-09-04 DIAGNOSIS — R059 Cough, unspecified: Secondary | ICD-10-CM

## 2018-09-04 DIAGNOSIS — R062 Wheezing: Secondary | ICD-10-CM | POA: Diagnosis not present

## 2018-09-04 DIAGNOSIS — R05 Cough: Secondary | ICD-10-CM | POA: Diagnosis not present

## 2018-09-04 DIAGNOSIS — J301 Allergic rhinitis due to pollen: Secondary | ICD-10-CM | POA: Diagnosis not present

## 2018-09-04 MED ORDER — LEVOCETIRIZINE DIHYDROCHLORIDE 5 MG PO TABS: 5.0000 mg | ORAL_TABLET | Freq: Every evening | ORAL | 0 refills | Status: DC

## 2018-09-04 MED ORDER — ALBUTEROL SULFATE HFA 108 (90 BASE) MCG/ACT IN AERS: 2.0000 | INHALATION_SPRAY | Freq: Four times a day (QID) | RESPIRATORY_TRACT | 2 refills | Status: DC | PRN

## 2018-09-04 MED ORDER — PROMETHAZINE-DM 6.25-15 MG/5ML PO SYRP: 5.0000 mL | ORAL_SOLUTION | Freq: Every evening | ORAL | 0 refills | Status: DC | PRN

## 2018-09-04 NOTE — Patient Instructions (Signed)

## 2018-09-04 NOTE — Progress Notes (Signed)
Subjective:    Patient ID: Heidi Lyons, female    DOB: 1995/06/05, 23 y.o.   MRN: [id number]  Heidi Lyons is a 23 y.o. female presenting on 09/04/2018 for Cough and Eye Problem   HPI  Virtual Visit via Telephone Note  I connected with Heidi Lyons on 09/04/18 at  3:00 PM EDT by video and verified that I am speaking with the correct person using two identifiers.   I discussed the limitations, risks, security and privacy concerns of performing an evaluation and management service by telephone and the availability of in person appointments. I also discussed with the patient that there may be a patient responsible charge related to this service. The patient expressed understanding and agreed to proceed.  Patient reports today with cough and eye issues ongoing for four days. She reports she has some trouble breathing when she goes to bed at night and lies flat. Reports nasal congestion, runny nose, post nasal drip and cough. She thinks she may be wheezing. She denies fevers, myalgia, recent travel. She denies lethargy. She denies asthma. She does not smoke. She also reports that her eyes are blood shot but also yellow on the bottom portion of her eye. She is not currently taking an allergy medication. Denies worsening facial pain.   Social History   Tobacco Use  . Smoking status: Never Smoker  . Smokeless tobacco: Never Used  Substance Use Topics  . Alcohol use: Yes    Alcohol/week: 12.0 standard drinks    Types: 10 Cans of beer, 2 Shots of liquor per week  . Drug use: No    Review of Systems Per HPI unless specifically indicated above     Objective:    There were no vitals taken for this visit.  Wt Readings from Last 3 Encounters:  07/23/18 147 lb 3.2 oz (66.8 kg)  05/21/18 141 lb (64 kg)  02/13/18 143 lb 12.8 oz (65.2 kg)    Physical Exam Constitutional:      General: She is not in acute distress.    Appearance: Normal appearance. She is not ill-appearing.  Eyes:       Conjunctiva/sclera:     Right eye: Hemorrhage present.     Left eye: Hemorrhage present.     Comments: Subconjunctival hemorrhage on inferior portions of both conjunctivae. Some yellowing around this area.   Pulmonary:     Effort: Pulmonary effort is normal. No respiratory distress.  Skin:    General: Skin is warm and dry.  Neurological:     Mental Status: She is alert.  Psychiatric:        Mood and Affect: Mood normal.        Behavior: Behavior normal.    Results for orders placed or performed in visit on 07/23/18  CBC w/Diff/Platelet  Result Value Ref Range   WBC 6.9 3.4 - 10.8 x10E3/uL   RBC 4.83 3.77 - 5.28 x10E6/uL   Hemoglobin 14.2 11.1 - 15.9 g/dL   Hematocrit 03.5 46.5 - 46.6 %   MCV 87 79 - 97 fL   MCH 29.4 26.6 - 33.0 pg   MCHC 34.0 31.5 - 35.7 g/dL   RDW 68.1 27.5 - 17.0 %   Platelets 326 150 - 450 x10E3/uL   Neutrophils 60 Not Estab. %   Lymphs 28 Not Estab. %   Monocytes 8 Not Estab. %   Eos 3 Not Estab. %   Basos 1 Not Estab. %   Neutrophils Absolute 4.2 1.4 - 7.0  x10E3/uL   Lymphocytes Absolute 1.9 0.7 - 3.1 x10E3/uL   Monocytes Absolute 0.5 0.1 - 0.9 x10E3/uL   EOS (ABSOLUTE) 0.2 0.0 - 0.4 x10E3/uL   Basophils Absolute 0.1 0.0 - 0.2 x10E3/uL   Immature Granulocytes 0 Not Estab. %   Immature Grans (Abs) 0.0 0.0 - 0.1 x10E3/uL  Comprehensive Metabolic Panel (CMET)  Result Value Ref Range   Glucose 83 65 - 99 mg/dL   BUN 11 6 - 20 mg/dL   Creatinine, Ser 1.66 0.57 - 1.00 mg/dL   GFR calc non Af Amer 97 >59 mL/min/1.73   GFR calc Af Amer 112 >59 mL/min/1.73   BUN/Creatinine Ratio 13 9 - 23   Sodium 141 134 - 144 mmol/L   Potassium 4.1 3.5 - 5.2 mmol/L   Chloride 104 96 - 106 mmol/L   CO2 19 (L) 20 - 29 mmol/L   Calcium 9.7 8.7 - 10.2 mg/dL   Total Protein 7.5 6.0 - 8.5 g/dL   Albumin 5.0 3.9 - 5.0 g/dL   Globulin, Total 2.5 1.5 - 4.5 g/dL   Albumin/Globulin Ratio 2.0 1.2 - 2.2   Bilirubin Total 0.6 0.0 - 1.2 mg/dL   Alkaline Phosphatase 63 39  - 117 IU/L   AST 20 0 - 40 IU/L   ALT 14 0 - 32 IU/L  TSH  Result Value Ref Range   TSH 2.990 0.450 - 4.500 uIU/mL  Lipid Profile  Result Value Ref Range   Cholesterol, Total 141 100 - 199 mg/dL   Triglycerides 83 0 - 149 mg/dL   HDL 61 >06 mg/dL   VLDL Cholesterol Cal 17 5 - 40 mg/dL   LDL Calculated 63 0 - 99 mg/dL   Chol/HDL Ratio 2.3 0.0 - 4.4 ratio      Assessment & Plan:  1. Subconjunctival hemorrhage of both eyes  Explained self limiting nature of this.  2. Cough  - promethazine-dextromethorphan (PROMETHAZINE-DM) 6.25-15 MG/5ML syrup; Take 5 mLs by mouth at bedtime as needed.  Dispense: 118 mL; Refill: 0  3. Allergic rhinitis due to pollen, unspecified seasonality  - levocetirizine (XYZAL) 5 MG tablet; Take 1 tablet (5 mg total) by mouth every evening.  Dispense: 90 tablet; Refill: 0  4. Wheezing  - albuterol (PROVENTIL HFA;VENTOLIN HFA) 108 (90 Base) MCG/ACT inhaler; Inhale 2 puffs into the lungs every 6 (six) hours as needed for wheezing or shortness of breath.  Dispense: 1 Inhaler; Refill: 2    Follow up plan: Return if symptoms worsen or fail to improve.  Osvaldo Angst, PA-C [facility] 09/04/2018, 4:12 PM

## 2018-12-02 ENCOUNTER — Encounter: Payer: Self-pay | Admitting: Physician Assistant

## 2018-12-02 ENCOUNTER — Other Ambulatory Visit: Payer: Self-pay

## 2018-12-02 ENCOUNTER — Ambulatory Visit: Payer: Managed Care, Other (non HMO) | Admitting: Physician Assistant

## 2018-12-02 VITALS — BP 117/68 | HR 105 | Temp 98.4°F | Resp 16 | Wt 157.0 lb

## 2018-12-02 DIAGNOSIS — R102 Pelvic and perineal pain: Secondary | ICD-10-CM | POA: Diagnosis not present

## 2018-12-02 DIAGNOSIS — G8929 Other chronic pain: Secondary | ICD-10-CM | POA: Diagnosis not present

## 2018-12-02 DIAGNOSIS — R109 Unspecified abdominal pain: Secondary | ICD-10-CM

## 2018-12-02 LAB — POCT URINALYSIS DIPSTICK
Bilirubin, UA: NEGATIVE
Blood, UA: NEGATIVE
Glucose, UA: NEGATIVE
Ketones, UA: NEGATIVE
Nitrite, UA: NEGATIVE
Protein, UA: NEGATIVE
Spec Grav, UA: 1.025 (ref 1.010–1.025)
Urobilinogen, UA: 0.2 E.U./dL
pH, UA: 6 (ref 5.0–8.0)

## 2018-12-02 LAB — POCT URINE PREGNANCY: Preg Test, Ur: NEGATIVE

## 2018-12-02 NOTE — Patient Instructions (Signed)
Pelvic Exam A pelvic exam is an exam of a woman's outer and inner genitals and reproductive organs. Pelvic exams are done to screen for health problems and to help prevent health problems from developing. During your pelvic exam, your health care provider may ask you questions about your health, your family's health, your menstrual periods, immunizations, and your sexual activity. The information shared between you and your health care provider will not be shared with anyone else. Who should have a pelvic exam? Talk with your health care provider about when and how often you should have a pelvic exam. There are many possible reasons you might have a pelvic exam. A pelvic exam may be recommended to check for:  Normal development and function of the reproductive organs.  Cancer of the cervix, ovaries, uterus, or vagina.  Signs of sexually transmitted infections (STIs) or other types of infections.  Pregnancy. If you are pregnant, a pelvic exam can also help determine how far along you are in your pregnancy.  Widening (dilation) of the cervix during labor.  Injury (trauma) to the reproductive organs. How is a pelvic exam done? Usually, a physical exam is done first. This may include:  An exam of your breasts. Your health care provider may feel your breasts to check for abnormalities.  An exam of your abdomen. Your health care provider may press on your abdomen to check for abnormalities. Pelvic exams may vary among health care providers and hospitals. The following things are usually done during a pelvic exam:  You will remove your clothes from the waist down. You will put on a gown or a wrap to cover yourself while you get ready for the exam.  You will lie on your back on an examination table. Your feet will be placed into foot rests (stirrups) so that your legs are wide apart and your knees are bent. A drape will be placed over your abdomen and your legs.  Your health care provider will  wear gloves and examine your outer genitals to check for anything unusual. This includes your clitoris, urethra, vaginal opening, labia, and the skin between your vagina and your anus (perineum).  Your health care provider will examine your inner genitals. To do this, a lubricated instrument (speculum) will be inserted into your vagina. The speculum will be widened to open the walls of your vagina. ? Your health care provider will examine your vagina and cervix. ? A Pap test, cervical biopsy, or cultures may be done as needed. ? After the internal exam is done, the speculum will be removed.  Your health care provider will insert two fingers into your vagina to gently press against various organs. ? Your health care provider may use his or her other hand to gently press on your lower abdomen while doing this. Depending on the purpose of your pelvic exam, your health care provider may perform:  A Pap test. This is sometimes called a Pap smear. It is a screening test that is used to check for signs of cancer of the cervix. The test can also identify the presence of infection or precancerous changes.  A cervical biopsy. This is the removal of a small sample of tissue from the cervix. The cervix is the lowest part of the womb (uterus), which opens into the vagina (birth canal). The tissue will be checked under a microscope.  Other diagnostic tests that involve taking samples of tissue or fluid (cultures).  If you have tests done, it is your responsibility to   get your test results. Ask your health care provider or the department performing the test when your results will be ready. What are the benefits of a pelvic exam? A pelvic exam may be recommended to help explain or diagnose:  Changes in your body that may be signs of cancer in the reproductive system.  Inability to get pregnant (infertility).  Vaginal itching or burning.  Abnormal vaginal discharge or bleeding.  Problems with sexual  function.  Problems with urination, such as: ? Painful urination. ? Frequent urinary tract infections. ? Inability to control when you urinate (urinary incontinence).  Problems with menstrual periods, such as: ? Severe cramping. ? Absence of any menstrual flow in a female by the age of 15 years (primary amenorrhea). ? Absence of menstrual flow for 3-6 months at a time (secondary amenorrhea).  Problems with the position of your pelvic organs due to weakening muscles (prolapse), such as: ? Uterine prolapse. ? Bladder prolapse. ? Rectal prolapse. What are the risks of a pelvic exam?  A pelvic exam is usually painless, although it can cause mild discomfort. If you experience pain at any time during your pelvic exam, tell your health care provider right away.  You may have very light bleeding after a pelvic exam, particularly if a biopsy or cultures were obtained. When should I seek medical care? Seek medical care after your pelvic exam if:  You develop new symptoms.  You experience pain or discomfort from anything that was done during your pelvic exam. Summary  A pelvic exam is an exam of a woman's outer and inner genitals and reproductive organs.  A pelvic exam may be recommended to help explain or diagnose various problems with your pelvic organs.  You may experience mild discomfort during a pelvic exam. This information is not intended to replace advice given to you by your health care provider. Make sure you discuss any questions you have with your health care provider. Document Released: 08/11/2002 Document Revised: 10/08/2017 Document Reviewed: 10/08/2017 Elsevier Patient Education  2020 [employer]  

## 2018-12-02 NOTE — Progress Notes (Signed)
Patient: Heidi Lyons Female    DOB: Sep 24, 1995   23 y.o.   MRN: [id number] Visit Date: 12/02/2018  Today's Provider: Trey SailorsAdriana M Carlethia Mesquita, PA-C   Chief Complaint  Patient presents with  . Abdominal Pain   Subjective:     HPI Patient with history of surgically confirmed endometriosis and constipation here today c/o abdominal pain on and off since Friday. Patient reports intermittent pain in the morning and afternoon, locates in pelvic region. Patient reports nausea and vomiting with pain, denies fever, diarrhea. Patient denies any painful urination or burning. No new sexual partners. Vomiting with pain. Has an appendix. Normal appetite.   Patient has been seen by Dr. Dalbert GarnetBeasley at [facility]. She received a Nexplanon which has not turned out to be suppressive. She was recommended to start Lupron but never got these injections. It was also suggested that she could get IUD but she reports she has had bad stories about this from her friends. Previously on gabapentin which made her too drowsy. Tried letrozole. Said this did not help. Tried Elavil 10 mg and instructed to titrate to 80 mg daily which she did not do because she felt this didn't help.  She was sent to [facility]. Was recommended to start Aygestin, Aygestin + Femara, or Orlissa. Patient did not start any of these, she is unsure why. Patient was also recommended to start pelvic floor physical therapy in [place]. She has not done this. She was also given vaginal valium. Has used this occasionally with some relief. She has some leftover medications at home, no relief.   Was supposed to get Lupron innjection.  Birth control pills. Nexplanon placed in 2018. Previously seen by Dr[facility] Nicanor [facility]Beasley [facility]. Has tried letorozole, gabapentin, and Elavil. Had numbness in hands  Vaginal valium   Pelvic floor therapy in [place].  No Known Allergies   Current Outpatient Medications:  .   albuterol (PROVENTIL HFA;VENTOLIN HFA) 108 (90 Base) MCG/ACT inhaler, Inhale 2 puffs into the lungs every 6 (six) hours as needed for wheezing or shortness of breath., Disp: 1 Inhaler, Rfl: 2 .  etonogestrel (NEXPLANON) 68 MG IMPL implant, 1 each by Subdermal route once., Disp: , Rfl:   Review of Systems  Gastrointestinal: Positive for abdominal pain, nausea and vomiting. Negative for blood in stool, constipation and diarrhea.    Social History   Tobacco Use  . Smoking status: Never Smoker  . Smokeless tobacco: Never Used  Substance Use Topics  . Alcohol use: Yes    Alcohol/week: 12.0 standard drinks    Types: 10 Cans of beer, 2 Shots of liquor per week      Objective:   BP 117/68 (BP Location: Left Arm, Patient Position: Sitting, Cuff Size: Normal)   Pulse (!) 105   Temp 98.4 F (36.9 C) (Oral)   Resp 16   Wt 157 lb (71.2 kg)   SpO2 98%   BMI 27.81 kg/m  Vitals:   12/02/18 1353  BP: 117/68  Pulse: (!) 105  Resp: 16  Temp: 98.4 F (36.9 C)  TempSrc: Oral  SpO2: 98%  Weight: 157 lb (71.2 kg)     Physical Exam   Results for orders placed or performed in visit on 12/02/18  POCT urinalysis dipstick  Result Value Ref Range   Color, UA yellow    Clarity, UA clear    Glucose, UA Negative Negative   Bilirubin, UA Negative  Ketones, UA Negative    Spec Grav, UA 1.025 1.010 - 1.025   Blood, UA Negative    pH, UA 6.0 5.0 - 8.0   Protein, UA Negative Negative   Urobilinogen, UA 0.2 0.2 or 1.0 E.U./dL   Nitrite, UA Negative    Leukocytes, UA Moderate (2+) (A) Negative   Appearance     Odor    POCT urine pregnancy  Result Value Ref Range   Preg Test, Ur Negative Negative       Assessment & Plan    1. Abdominal pain, unspecified abdominal location  I feel her abdominal pain is consistent with her known chronic pelvic pain. She has had numerous therapies suggested to her which for various reasons she has not completed. Offered referral to pelvic floor PT,  which she declines. I can start a medication that [facility] pelvic pain referral suggested but this would be to hold her over until she can see her OBGYN. I feel this chronic problem would best be managed by her specialists, especially since there have been multiple treatments offered that she hasn't tried. She wants to go home and see which medications she has and might try those again. Will message back to update.  - POCT urinalysis dipstick - POCT urine pregnancy  2. Chronic pelvic pain in female  I have spent 25 minutes with this patient, >50% of which was spent on counseling and coordination of care.  The entirety of the information documented in the History of Present Illness, Review of Systems and Physical Exam were personally obtained by me. Portions of this information were initially documented by Lynford Humphrey, CMA and reviewed by me for thoroughness and accuracy.   F/u PRN.      Trinna Post, PA-C  [facility]

## 2019-03-12 ENCOUNTER — Telehealth: Payer: Self-pay | Admitting: Physician Assistant

## 2019-03-12 NOTE — Telephone Encounter (Signed)
Spoke with patient and she said she is needing the tb skin test. Patient was advised that we do not do the skin test here but she can go to the health department and get it there. She also said that she had a paper that needs to be fill out and that anyone that works here can sign it. I asked what kind of paper she wasn't sure. Did advised patient to bring the form or to call back with more information.

## 2019-03-12 NOTE — Telephone Encounter (Signed)
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  Please call pt back to let her know if this can be done.  Thanks, [employer]

## 2019-03-13 ENCOUNTER — Other Ambulatory Visit: Payer: Self-pay

## 2019-03-13 ENCOUNTER — Ambulatory Visit (LOCAL_COMMUNITY_HEALTH_CENTER): Payer: Self-pay

## 2019-03-13 DIAGNOSIS — Z111 Encounter for screening for respiratory tuberculosis: Secondary | ICD-10-CM

## 2019-03-16 ENCOUNTER — Ambulatory Visit (LOCAL_COMMUNITY_HEALTH_CENTER): Payer: Self-pay

## 2019-03-16 ENCOUNTER — Other Ambulatory Visit: Payer: Self-pay

## 2019-03-16 DIAGNOSIS — Z111 Encounter for screening for respiratory tuberculosis: Secondary | ICD-10-CM

## 2019-03-16 LAB — TB SKIN TEST
Induration: 0 mm
TB Skin Test: NEGATIVE

## 2019-07-21 ENCOUNTER — Other Ambulatory Visit: Payer: Self-pay | Admitting: Obstetrics and Gynecology

## 2019-07-21 DIAGNOSIS — N6320 Unspecified lump in the left breast, unspecified quadrant: Secondary | ICD-10-CM

## 2019-07-21 DIAGNOSIS — N644 Mastodynia: Secondary | ICD-10-CM

## 2019-07-28 ENCOUNTER — Ambulatory Visit
Admission: RE | Admit: 2019-07-28 | Discharge: 2019-07-28 | Disposition: A | Discharge status: Home / Self Care | Payer: Managed Care, Other (non HMO) | Source: Ambulatory Visit | Attending: Obstetrics and Gynecology | Admitting: Obstetrics and Gynecology

## 2019-07-28 DIAGNOSIS — N644 Mastodynia: Secondary | ICD-10-CM | POA: Diagnosis present

## 2019-07-28 DIAGNOSIS — N6320 Unspecified lump in the left breast, unspecified quadrant: Secondary | ICD-10-CM | POA: Insufficient documentation

## 2019-07-28 IMAGING — US US BREAST*L* LIMITED INC AXILLA
1 series · 6 of 6 positions shown · non-contrast
Comparison: None.

CLINICAL DATA: 23-year-old female presenting for evaluation of
significant tenderness in the medial aspect of the left breast for
about 3 weeks. The patient states that it is constant. She has not
noticed any palpable lumps, skin changes or swelling of the left
breast. She does have family history of breast cancer in her mother
who was diagnosed at about age 34 and her mother's sister. She
states her mother was genetically tested, and was negative.

EXAM:
ULTRASOUND OF THE LEFT BREAST

[Series 1: us breast*left* limited inc axilla · 0.07mm/px · 6 of 6 slices shown]
[im 1/6]
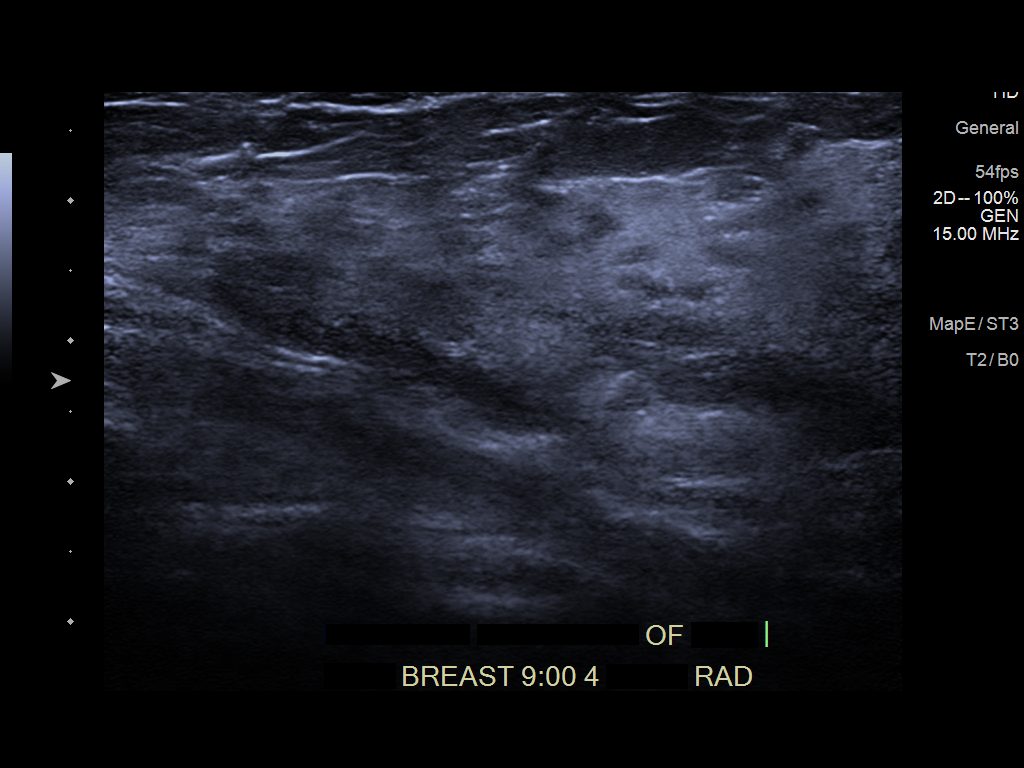
[im 2/6]
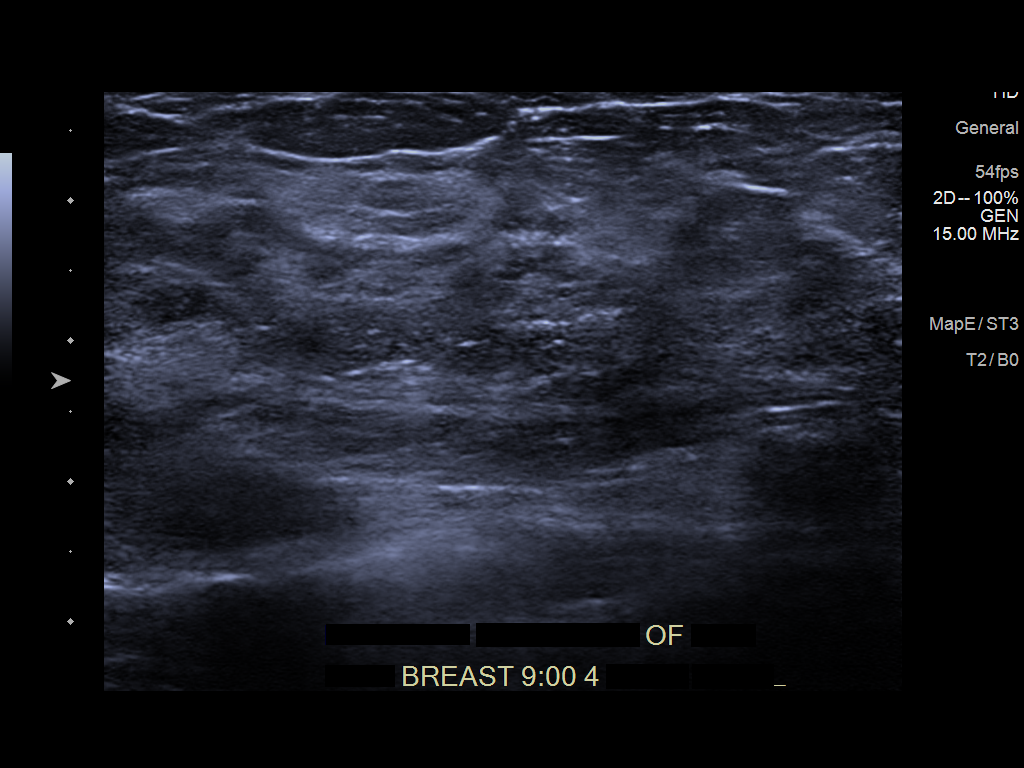
[im 3/6]
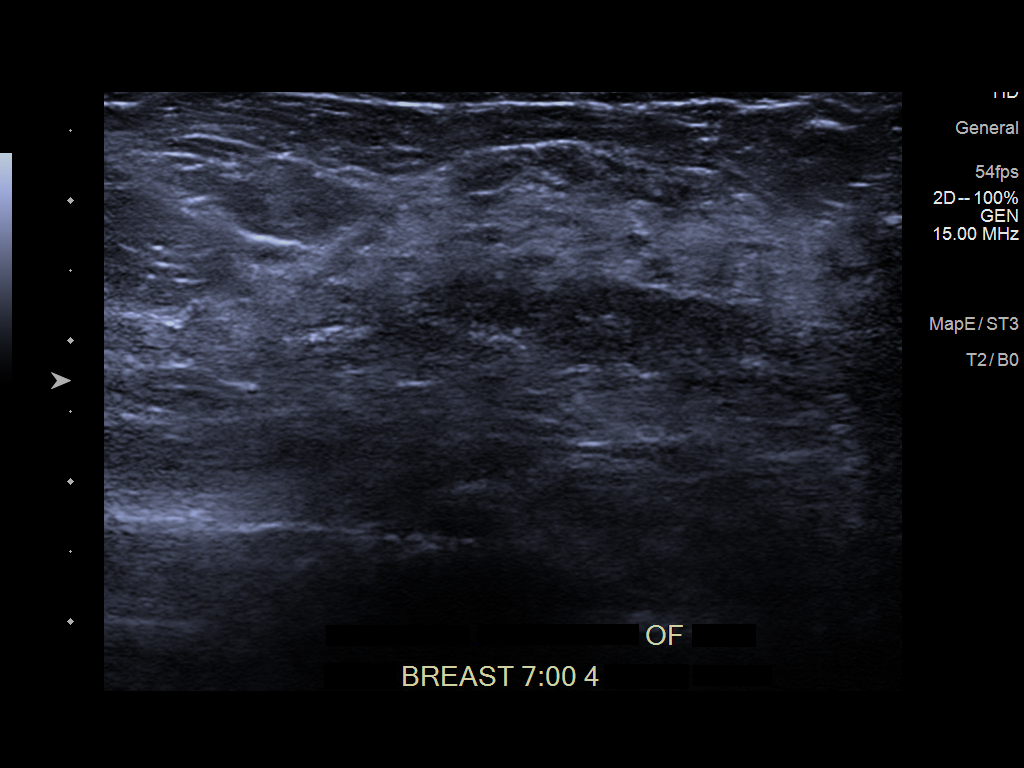
[im 4/6]
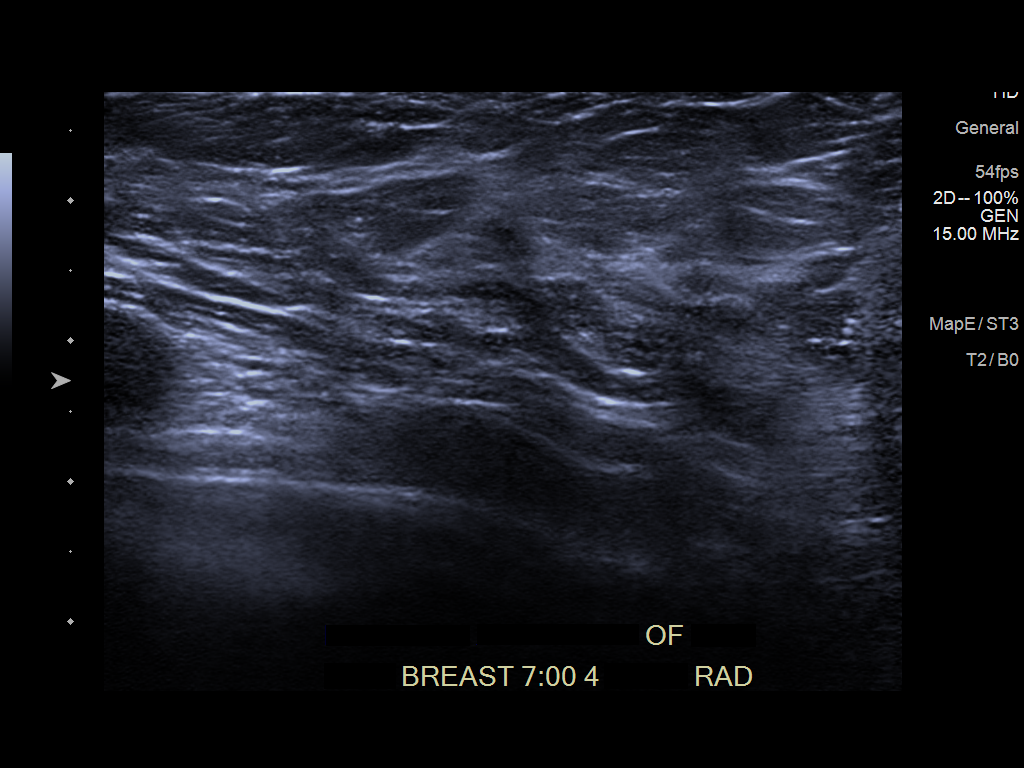
[im 5/6]
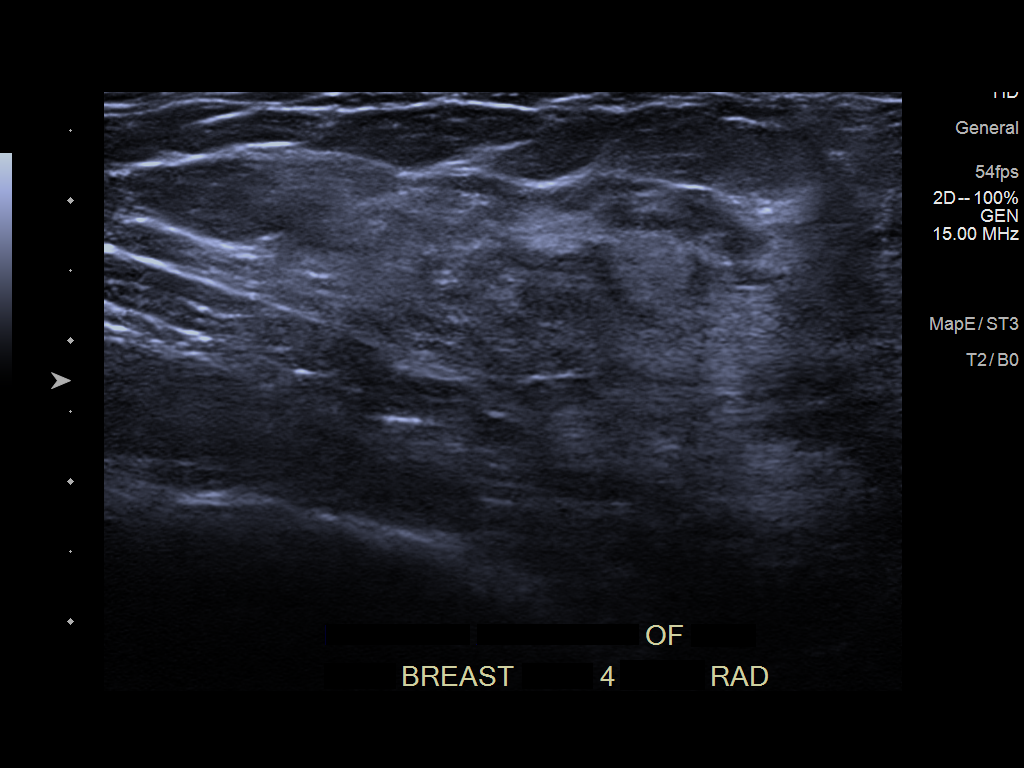
[im 6/6]
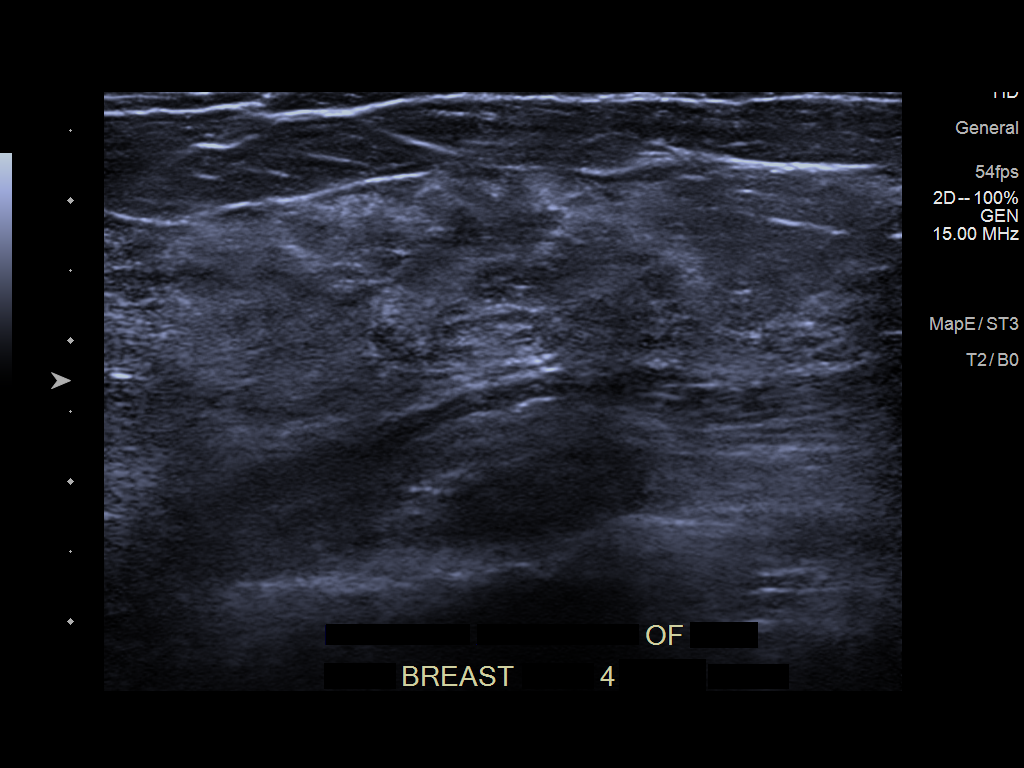

[6 of 6 positions shown; findings below may reference images not displayed]

FINDINGS: On physical exam, no suspicious masses are palpated in the medial
aspect of the left breast. Significant tenderness is elicited
particularly at the 8-9 o'clock location.

Targeted ultrasound is performed, showing normal dense
fibroglandular tissue in the upper inner and lower inner quadrants
of the left breast. No suspicious masses or areas of shadowing are
identified.
IMPRESSION: 1. Normal targeted ultrasound. No findings to explain the patient's
left breast pain.

RECOMMENDATION:
1. Clinical follow-up recommended for the tender area of concern in
the medial left breast. Any further workup should be based on
clinical grounds.

2. Consider genetics assessment to determine the patient's lifetime
risk of breast cancer given her family history, if this has not
already been performed. Per American Cancer Society guidelines, if
the patient has a calculated lifetime risk of developing breast
cancer of greater than 20%, annual screening MRI of the breasts
would be recommended at the time of screening mammography.

3. The patient should start screening mammography 10 years before
her mother's age at diagnosis, but no earlier than age 25.

I have discussed the findings and recommendations with the patient.
If applicable, a reminder letter will be sent to the patient
regarding the next appointment.

BI-RADS CATEGORY  1: Negative.

## 2019-11-20 ENCOUNTER — Ambulatory Visit: Payer: Self-pay | Admitting: *Deleted

## 2019-11-20 NOTE — Telephone Encounter (Signed)
Pt called in c/o feeling short of breath even at rest, Coughing thick mucus, chest pain with coughing, fever as high as 102 yesterday, still with fever today 99.8.  She has not eaten anything since Wednesday afternoon when she started feeling sick.   She is having diarrhea and lower abd pain.  Also has nasal congestion. 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  There has been no mention of being tested for COVID-19.   I suggested she be tested.  She mentioned going to the [employer] in clinic at [facility].   I let her know that would be a good idea.   If they determine she needs more care they can transfer her right there to the ED.   She was agreeable to going to [place].  Protocol is to be seen within 4 hours so I encouraged her to go on over there which she agreed to do.  I sent my notes to [facility] for Joycelyn Man, PA-C to be aware.   Reason for Disposition . [1] MILD difficulty breathing (e.g., minimal/no SOB at rest, SOB with walking, pulse <100) AND [2] NEW-onset or WORSE than normal    Coughing , runny nose, diarrhea, abd pain since Wed  Answer Assessment - Initial Assessment Questions 1. RESPIRATORY STATUS: "Describe your breathing?" (e.g., wheezing, shortness of breath, unable to speak, severe coughing)      Wed. Got off work and started then.  Can't eat or drink with bad headaches and pain in my lower stomach and trouble breathing. Short of breath even with sitting.   I work in a daycare and there has been several sick children with same symptoms.  No change in taste or smell.   I'm having diarrhea too. 2. ONSET: "When did this breathing problem begin?"      Wednesday afternoon. 3. PATTERN "Does the difficult breathing come and go, or has it been constant since it started?"      Shortness of breath is all the time.   4. SEVERITY: "How bad is your breathing?" (e.g., mild, moderate,  severe)    - MILD: No SOB at rest, mild SOB with walking, speaks normally in sentences, can lay down, no retractions, pulse < 100.    - MODERATE: SOB at rest, SOB with minimal exertion and prefers to sit, cannot lie down flat, speaks in phrases, mild retractions, audible wheezing, pulse 100-120.    - SEVERE: Very SOB at rest, speaks in single words, struggling to breathe, sitting hunched forward, retractions, pulse > 120      No allergies.  Moderate there all the time.   I'm coughing makes me feel like I need to throw up but I don't.   No vomiting.   5. RECURRENT SYMPTOM: "Have you had difficulty breathing before?" If so, ask: "When was the last time?" and "What happened that time?"      99.8 is fever 20 minutes ago.    I checked temp yesterday 102. I took Advil yesterday for the headache.  Didn't really help.  Not tried any OTC medications for the symptoms.  Runny nose. 6. CARDIAC HISTORY: "Do you have any history of heart disease?" (e.g., heart attack, angina, bypass surgery, angioplasty)      No 7. LUNG HISTORY: "Do you have any history of lung disease?"  (e.g., pulmonary embolus, asthma, emphysema)     No 8. CAUSE: "What do you think  is causing the breathing problem?"      I was exposed to children who were sick at the daycare where I work. 9. OTHER SYMPTOMS: "Do you have any other symptoms? (e.g., dizziness, runny nose, cough, chest pain, fever)     Runny nose, coughing, fever, chest pain with coughing.  I'm having lower abd pain too.  Diarrhea.   The pain comes and goes.  I have endometrioses it might be that. 10. PREGNANCY: "Is there any chance you are pregnant?" "When was your last menstrual period?"       No 11. TRAVEL: "Have you traveled out of the country in the last month?" (e.g., travel history, exposures)       No travel or COVID exposures.  Protocols used: BREATHING DIFFICULTY-A-AH

## 2020-03-10 ENCOUNTER — Other Ambulatory Visit: Payer: Self-pay

## 2020-03-10 ENCOUNTER — Encounter: Payer: Self-pay | Admitting: Emergency Medicine

## 2020-03-10 ENCOUNTER — Emergency Department: Payer: Managed Care, Other (non HMO)

## 2020-03-10 ENCOUNTER — Emergency Department
Admission: EM | Admit: 2020-03-10 | Discharge: 2020-03-11 | Disposition: A | Discharge status: Home / Self Care | Payer: Managed Care, Other (non HMO) | Attending: Emergency Medicine | Admitting: Emergency Medicine

## 2020-03-10 DIAGNOSIS — Z20822 Contact with and (suspected) exposure to covid-19: Secondary | ICD-10-CM | POA: Diagnosis not present

## 2020-03-10 DIAGNOSIS — J189 Pneumonia, unspecified organism: Secondary | ICD-10-CM | POA: Insufficient documentation

## 2020-03-10 DIAGNOSIS — R091 Pleurisy: Secondary | ICD-10-CM | POA: Insufficient documentation

## 2020-03-10 DIAGNOSIS — R079 Chest pain, unspecified: Secondary | ICD-10-CM

## 2020-03-10 LAB — CBC WITH DIFFERENTIAL/PLATELET
Abs Immature Granulocytes: 0.06 10*3/uL (ref 0.00–0.07)
Basophils Absolute: 0.1 10*3/uL (ref 0.0–0.1)
Basophils Relative: 1 %
Eosinophils Absolute: 0.6 10*3/uL — ABNORMAL HIGH (ref 0.0–0.5)
Eosinophils Relative: 4 %
HCT: 40.3 % (ref 36.0–46.0)
Hemoglobin: 13.8 g/dL (ref 12.0–15.0)
Immature Granulocytes: 0 %
Lymphocytes Relative: 19 %
Lymphs Abs: 3 10*3/uL (ref 0.7–4.0)
MCH: 29.2 pg (ref 26.0–34.0)
MCHC: 34.2 g/dL (ref 30.0–36.0)
MCV: 85.4 fL (ref 80.0–100.0)
Monocytes Absolute: 0.9 10*3/uL (ref 0.1–1.0)
Monocytes Relative: 6 %
Neutro Abs: 10.8 10*3/uL — ABNORMAL HIGH (ref 1.7–7.7)
Neutrophils Relative %: 70 %
Platelets: 296 10*3/uL (ref 150–400)
RBC: 4.72 MIL/uL (ref 3.87–5.11)
RDW: 11.9 % (ref 11.5–15.5)
WBC: 15.4 10*3/uL — ABNORMAL HIGH (ref 4.0–10.5)
nRBC: 0 % (ref 0.0–0.2)

## 2020-03-10 LAB — TROPONIN I (HIGH SENSITIVITY)
Troponin I (High Sensitivity): <2 ng/L (ref <18)
Troponin I (High Sensitivity): <2 ng/L (ref <18)

## 2020-03-10 LAB — COMPREHENSIVE METABOLIC PANEL
ALT: 11 U/L (ref 0–44)
AST: 17 U/L (ref 15–41)
Albumin: 4.5 g/dL (ref 3.5–5.0)
Alkaline Phosphatase: 41 U/L (ref 38–126)
Anion gap: 10 (ref 5–15)
BUN: 10 mg/dL (ref 6–20)
CO2: 23 mmol/L (ref 22–32)
Calcium: 9 mg/dL (ref 8.9–10.3)
Chloride: 104 mmol/L (ref 98–111)
Creatinine, Ser: 0.74 mg/dL (ref 0.44–1.00)
GFR calc non Af Amer: >60 mL/min (ref >60)
Glucose, Bld: 100 mg/dL — ABNORMAL HIGH (ref 70–99)
Potassium: 3.6 mmol/L (ref 3.5–5.1)
Sodium: 137 mmol/L (ref 135–145)
Total Bilirubin: 0.6 mg/dL (ref 0.3–1.2)
Total Protein: 8.1 g/dL (ref 6.5–8.1)

## 2020-03-10 MED ORDER — KETOROLAC TROMETHAMINE 30 MG/ML IJ SOLN
30.0000 mg | Freq: Once | INTRAMUSCULAR | Status: AC
  Administered 2020-03-10: 30 mg via INTRAVENOUS
  Filled 2020-03-10: qty 1

## 2020-03-10 NOTE — ED Provider Notes (Signed)
[facility] Emergency Department Provider Note __________   First MD Initiated Contact with Patient 03/10/20 2333     (approximate)  I have reviewed the triage vital signs and the nursing notes.   HISTORY  Chief Complaint Chest Pain    HPI Heidi Lyons is a 24 y.o. female with below list of previous medical conditions presents to the emergency department secondary to persistent chest pain which patient states began this morning and is progressed over the course of the day.  Patient states that current pain score is 10 out of 10.  Patient states that pain is worse with deep inspiration.  Patient also admits to dyspnea and subjective fever.  Patient denies any known sick contact.  Patient denies any preceding illness before today.  Patient states that pain is actually better with laying flat and worse with sitting up.  Patient admits to occasional cough.        Past Medical History:  Diagnosis Date   Anxiety    Chronic pelvic pain in female    Depression    Endometriosis     Patient Active Problem List   Diagnosis Date Noted   Chronic pelvic pain in female 12/02/2018    Past Surgical History:  Procedure Laterality Date   CHROMOPERTUBATION Bilateral 02/08/2017   Procedure: CHROMOPERTUBATION;  Surgeon: [name], MD;  Location: [facility] ORS;  Service: Gynecology;  Laterality: Bilateral;   LAPAROSCOPY N/A 02/08/2017   Procedure: LAPAROSCOPY OPERATIVE WITH PERITONEAL BIOPSIES AND FULGERATION OF ENDOMETRIOSIS;  Surgeon: [name], MD;  Location: [facility] ORS;  Service: Gynecology;  Laterality: N/A;   NO PAST SURGERIES      Prior to Admission medications   Medication Sig Start Date End Date Taking? Authorizing Provider  albuterol (PROVENTIL HFA;VENTOLIN HFA) 108 (90 Base) MCG/ACT inhaler Inhale 2 puffs into the lungs every 6 (six) hours as needed for wheezing or shortness of breath. 09/04/18   Trey Sailors, PA-C  azithromycin  (ZITHROMAX) 500 MG tablet Take 1 tablet (500 mg total) by mouth daily for 3 days. Take 1 tablet daily for 3 days. 03/11/20 03/14/20  Darci Current, MD  etonogestrel (NEXPLANON) 68 MG IMPL implant 1 each by Subdermal route once.    [provider]  ibuprofen (ADVIL) 800 MG tablet Take 1 tablet (800 mg total) by mouth every 8 (eight) hours as needed. 03/11/20   Darci Current, MD    Allergies Patient has no known allergies.  Family History  Problem Relation Age of Onset   Cancer Mother        Breast cancer/cervical   Cancer Paternal Uncle        pancreatic    Social History Social History   Tobacco Use   Smoking status: Never Smoker   Smokeless tobacco: Never Used  Vaping Use   Vaping Use: Never used  Substance Use Topics   Alcohol use: Yes    Alcohol/week: 12.0 standard drinks    Types: 10 Cans of beer, 2 Shots of liquor per week   Drug use: No    Review of Systems Constitutional: No fever/chills Eyes: No visual changes. ENT: No sore throat. Cardiovascular: Positive for chest pain. Respiratory: Positive for shortness of breath. Gastrointestinal: No abdominal pain.  No nausea, no vomiting.  No diarrhea.  No constipation. Genitourinary: Negative for dysuria. Musculoskeletal: Negative for neck pain.  Negative for back pain. Integumentary: Negative for rash. Neurological: Negative for headaches, focal weakness or numbness.   ____________________________________________   PHYSICAL  EXAM:  VITAL SIGNS: ED Triage Vitals  Enc Vitals Group     BP 03/10/20 1921 (!) 113/52     Pulse Rate 03/10/20 1921 99     Resp 03/10/20 1921 20     Temp 03/10/20 1921 99.3 F (37.4 C)     Temp Source 03/10/20 1921 Oral     SpO2 03/10/20 1921 100 %     Weight 03/10/20 1922 66.7 kg (147 lb)     Height 03/10/20 1922 1.6 m (5\' 3" )     Head Circumference --      Peak Flow --      Pain Score 03/10/20 1946 10     Pain Loc --      Pain Edu? --      Excl. in GC? --       Constitutional: Alert and oriented.  Eyes: Conjunctivae are normal.  Head: Atraumatic. Mouth/Throat: Patient is wearing a mask. Neck: No stridor.  No meningeal signs.   Cardiovascular: Normal rate, regular rhythm. Good peripheral circulation. Grossly normal heart sounds. Respiratory: Normal respiratory effort.  No retractions. Gastrointestinal: Soft and nontender. No distention.  Musculoskeletal: No lower extremity tenderness nor edema. No gross deformities of extremities. Neurologic:  Normal speech and language. No gross focal neurologic deficits are appreciated.  Skin:  Skin is warm, dry and intact. Psychiatric: Mood and affect are normal. Speech and behavior are normal.  ____________________________________________   LABS (all labs ordered are listed, but only abnormal results are displayed)  Labs Reviewed  CBC WITH DIFFERENTIAL/PLATELET - Abnormal; Notable for the following components:      Result Value   WBC 15.4 (*)    Neutro Abs 10.8 (*)    Eosinophils Absolute 0.6 (*)    All other components within normal limits  COMPREHENSIVE METABOLIC PANEL - Abnormal; Notable for the following components:   Glucose, Bld 100 (*)    All other components within normal limits  RESPIRATORY PANEL BY RT PCR (FLU A&B, COVID)  SEDIMENTATION RATE  C-REACTIVE PROTEIN  TROPONIN I (HIGH SENSITIVITY)  TROPONIN I (HIGH SENSITIVITY)   ____________________________________________  EKG  ED ECG REPORT I, [place] N Van Ehlert, the attending physician, personally viewed and interpreted this ECG.   Date: 03/10/2020  EKG Time: 7:21 PM  Rate: 92  Rhythm: Normal sinus rhythm this  Axis: Normal  Intervals: Normal  ST&T Change: None  ED ECG REPORT I, [place] N Sharis Keeran, the attending physician, personally viewed and interpreted this ECG.   Date: 03/10/2020  EKG Time: 11:41 PM  Rate: 76  Rhythm: Normal sinus rhythm  Axis: Normal  Intervals: Normal  ST&T Change:  None  ____________________________________________  RADIOLOGY I, [place] N Chanta Bauers, personally viewed and evaluated these images (plain radiographs) as part of my medical decision making, as well as reviewing the written report by the radiologist.  ED MD interpretation: No active cardiopulmonary disease noted on chest x-ray per radiologist  Official radiology report(s): DG Chest 2 View  Result Date: 03/10/2020 CLINICAL DATA:  Chest pain EXAM: CHEST - 2 VIEW COMPARISON:  Oct 15, 2017 FINDINGS: The heart size and mediastinal contours are within normal limits. Both lungs are clear. The visualized skeletal structures are unremarkable. Mild S-shaped scoliotic curvature is noted. IMPRESSION: No active cardiopulmonary disease. Electronically Signed   By: Oct 17, 2017 M.D.   On: 03/10/2020 19:49   CT Angio Chest PE W and/or Wo Contrast  Result Date: 03/11/2020 CLINICAL DATA:  Chest pain, dyspnea, fever EXAM: CT ANGIOGRAPHY CHEST WITH CONTRAST TECHNIQUE:  Multidetector CT imaging of the chest was performed using the standard protocol during bolus administration of intravenous contrast. Multiplanar CT image reconstructions and MIPs were obtained to evaluate the vascular anatomy. CONTRAST:  49mL OMNIPAQUE IOHEXOL 350 MG/ML SOLN COMPARISON:  Radiograph same day FINDINGS: Cardiovascular: There is a optimal opacification of the pulmonary arteries. There is no central,segmental, or subsegmental filling defects within the pulmonary arteries. The heart is normal in size. No pericardial effusion or thickening. No evidence right heart strain. There is normal three-vessel brachiocephalic anatomy without proximal stenosis. The thoracic aorta is normal in appearance. Mediastinum/Nodes: No hilar, mediastinal, or axillary adenopathy. Thyroid gland, trachea, and esophagus demonstrate no significant findings. Lungs/Pleura: Minimal bibasilar patchy airspace opacity seen at the posterior left lung base. A small left pleural  effusion is present. No pleural effusion or pneumothorax. No airspace consolidation. Upper Abdomen: No acute abnormalities present in the visualized portions of the upper abdomen. Musculoskeletal: No chest wall abnormality. No acute or significant osseous findings. Review of the MIP images confirms the above findings. IMPRESSION: No central, segmental, or subsegmental pulmonary embolism. Patchy airspace opacity in the posterior left lung base which could be due to atelectasis and/or infectious etiology Small left pleural effusion Electronically Signed   By: Jonna Clark M.D.   On: 03/11/2020 00:45      Procedures   ____________________________________________   INITIAL IMPRESSION / MDM / ASSESSMENT AND PLAN / ED COURSE  As part of my medical decision making, I reviewed the following data within the electronic MEDICAL RECORD NUMBER   24 year old female presented with above-stated history and physical exam a differential diagnosis including but not limited to ACS, pericarditis, pulmonary emboli, pleurisy possibly secondary to pneumonia.  KG revealed no evidence of ischemia or infarction and no findings of pericarditis including no ST segment elevation or PR depression.  Laboratory data revealed normal troponin x2.  Additional laboratory data revealed normal sed rate.  CT scan of the chest revealed no evidence of pulmonary emboli or pericardial effusion however did reveal possible left lower lobe infiltrate per the radiologist.  As such concern for possible pneumonia and patient given azithromycin in the emergency department and will be prescribed the same for home..  Patient was given Toradol 30 mg IV on arrival with resolution of pain.  Patient referred to cardiology for further outpatient evaluation. ____________________________________________  FINAL CLINICAL IMPRESSION(S) / ED DIAGNOSES  Final diagnoses:  Pleurisy  Chest pain, unspecified type  Community acquired pneumonia of left lower lobe of  lung     MEDICATIONS GIVEN DURING THIS VISIT:  Medications  ketorolac (TORADOL) 30 MG/ML injection 30 mg (30 mg Intravenous Given 03/10/20 2348)  iohexol (OMNIPAQUE) 350 MG/ML injection 75 mL (75 mLs Intravenous Contrast Given 03/11/20 0028)  azithromycin (ZITHROMAX) tablet 500 mg (500 mg Oral Given 03/11/20 0210)     ED Discharge Orders         Ordered    azithromycin (ZITHROMAX) 500 MG tablet  Daily        03/11/20 0204    ibuprofen (ADVIL) 800 MG tablet  Every 8 hours PRN        03/11/20 0204          *Please note:  Marisue Ocheltree was evaluated in Emergency Department on 03/11/2020 for the symptoms described in the history of present illness. She was evaluated in the context of the global COVID-19 pandemic, which necessitated consideration that the patient might be at risk for infection with the SARS-CoV-2 virus that causes COVID-19. Institutional  protocols and algorithms that pertain to the evaluation of patients at risk for COVID-19 are in a state of rapid change based on information released by regulatory bodies including the CDC and federal and state organizations. These policies and algorithms were followed during the patient's care in the ED.  Some ED evaluations and interventions may be delayed as a result of limited staffing during and after the pandemic.*  Note:  This document was prepared using Dragon voice recognition software and may include unintentional dictation errors.   Darci CurrentBrown, Steward N, MD 03/11/20 [phone]

## 2020-03-10 NOTE — ED Triage Notes (Addendum)
First nurse note- arrived EMS, here for CP that woke pt up from sleep.  EKG WNL for EMS, with low ST.  VSS with EMS. No medical hx. 324 mg ASA

## 2020-03-10 NOTE — ED Triage Notes (Signed)
Pt brought in via ems from mother in laws home.  Pt has chest pain since this am.  No n/v  Pain radiates into left arm and back   Pt alert  Speech clear.

## 2020-03-11 ENCOUNTER — Emergency Department: Payer: Managed Care, Other (non HMO)

## 2020-03-11 ENCOUNTER — Encounter: Payer: Self-pay | Admitting: Radiology

## 2020-03-11 LAB — RESPIRATORY PANEL BY RT PCR (FLU A&B, COVID)
Influenza A by PCR: NEGATIVE
Influenza B by PCR: NEGATIVE
SARS Coronavirus 2 by RT PCR: NEGATIVE

## 2020-03-11 LAB — C-REACTIVE PROTEIN: CRP: 0.6 mg/dL (ref <1.0)

## 2020-03-11 LAB — SEDIMENTATION RATE: Sed Rate: 4 mm/hr (ref 0–20)

## 2020-03-11 MED ORDER — AZITHROMYCIN 500 MG PO TABS: 500.0000 mg | ORAL_TABLET | Freq: Every day | ORAL | 0 refills | Status: AC

## 2020-03-11 MED ORDER — IOHEXOL 350 MG/ML SOLN
75.0000 mL | Freq: Once | INTRAVENOUS | Status: AC | PRN
  Administered 2020-03-11: 75 mL via INTRAVENOUS

## 2020-03-11 MED ORDER — IBUPROFEN 800 MG PO TABS: 800.0000 mg | ORAL_TABLET | Freq: Three times a day (TID) | ORAL | 0 refills | Status: DC | PRN

## 2020-03-11 MED ORDER — AZITHROMYCIN 500 MG PO TABS
500.0000 mg | ORAL_TABLET | Freq: Once | ORAL | Status: AC
  Administered 2020-03-11: 500 mg via ORAL
  Filled 2020-03-11: qty 1

## 2020-03-11 NOTE — ED Notes (Signed)
PT given DC paperwork. IV removed. Pt assisted with dressing and calling ride.

## 2020-04-18 ENCOUNTER — Ambulatory Visit (INDEPENDENT_AMBULATORY_CARE_PROVIDER_SITE_OTHER): Payer: Managed Care, Other (non HMO) | Admitting: Physician Assistant

## 2020-04-18 ENCOUNTER — Encounter: Payer: Self-pay | Admitting: Physician Assistant

## 2020-04-18 DIAGNOSIS — H66002 Acute suppurative otitis media without spontaneous rupture of ear drum, left ear: Secondary | ICD-10-CM | POA: Diagnosis not present

## 2020-04-18 DIAGNOSIS — G4489 Other headache syndrome: Secondary | ICD-10-CM

## 2020-04-18 MED ORDER — AMOXICILLIN 875 MG PO TABS: 875.0000 mg | ORAL_TABLET | Freq: Two times a day (BID) | ORAL | 0 refills | Status: DC

## 2020-04-18 MED ORDER — PREDNISONE 10 MG (21) PO TBPK: ORAL_TABLET | ORAL | 0 refills | Status: DC

## 2020-04-18 NOTE — Progress Notes (Signed)
Virtual telephone visit    Virtual Visit via Telephone Note   This visit type was conducted due to national recommendations for restrictions regarding the COVID-19 Pandemic (e.g. social distancing) in an effort to limit this patient's exposure and mitigate transmission in our community. Due to her co-morbid illnesses, this patient is at least at moderate risk for complications without adequate follow up. This format is felt to be most appropriate for this patient at this time. The patient did not have access to video technology or had technical difficulties with video requiring transitioning to audio format only (telephone). Physical exam was limited to content and character of the telephone converstion.    Patient location: Home Provider location: [facility]  I discussed the limitations of evaluation and management by telemedicine and the availability of in person appointments. The patient expressed understanding and agreed to proceed.   Visit Date: 04/18/2020  Today's healthcare provider: Margaretann Loveless, PA-C   Chief Complaint  Patient presents with  . Headache   Subjective    Headache  This is a new problem. The current episode started 1 to 4 weeks ago (Last week and this morning she woke up hurting really bad). The problem has been gradually worsening. The pain is located in the frontal region. The pain radiates to the face. The pain quality is not similar to prior headaches. The quality of the pain is described as aching, throbbing and sharp. The pain is at a severity of 10/10. The pain is severe. Associated symptoms include ear pain and a sore throat. Pertinent negatives include no blurred vision, dizziness, eye pain, facial sweating, fever, visual change, vomiting or weakness. Associated symptoms comments: Pain behind her eye and behind her L ear. Nothing aggravates the symptoms. She has tried NSAIDs (Took 1 IBU 800mg ) for the symptoms. The treatment provided no relief.  Headache  has been persistent since last week. Started last week and can go to sleep and will wake up with headache still present. Feels pain in the eyes. Eyes water when she closes them. Feels the left side of her face feels swollen. Has left ear pain. Denies hearing loss or tinnitus. No ear drainage. Reports mild sore throat. Does report pain with biting/clenching teeth. No fever or nausea. IBU 800mg  helped some.   Patient Active Problem List   Diagnosis Date Noted  . Chronic pelvic pain in female 12/02/2018   Past Medical History:  Diagnosis Date  . Anxiety   . Chronic pelvic pain in female   . Depression   . Endometriosis       Medications: Outpatient Medications Prior to Visit  Medication Sig  . ibuprofen (ADVIL) 800 MG tablet Take 1 tablet (800 mg total) by mouth every 8 (eight) hours as needed.  albuterol (PROVENTIL HFA;VENTOLIN HFA) 108 (90 Base) MCG/ACT inhaler Inhale 2 puffs into the lungs every 6 (six) hours as needed for wheezing or shortness of breath.  . etonogestrel (NEXPLANON) 68 MG IMPL implant 1 each by Subdermal route once.   No facility-administered medications prior to visit.    Review of Systems  Constitutional: Negative for fever.  HENT: Positive for ear pain and sore throat.   Eyes: Negative for blurred vision, pain and visual disturbance.  Respiratory: Negative.   Cardiovascular: Negative.   Gastrointestinal: Negative for vomiting.  Neurological: Positive for headaches. Negative for dizziness and weakness.    Last CBC Lab Results  Component Value Date   WBC 15.4 (H) 03/10/2020   HGB 13.8  03/10/2020   HCT 40.3 03/10/2020   MCV 85.4 03/10/2020   MCH 29.2 03/10/2020   RDW 11.9 03/10/2020   PLT 296 03/10/2020   Last metabolic panel Lab Results  Component Value Date   GLUCOSE 100 (H) 03/10/2020   NA 137 03/10/2020   K 3.6 03/10/2020   CL 104 03/10/2020   CO2 23 03/10/2020   BUN 10 03/10/2020   CREATININE 0.74 03/10/2020   GFRNONAA >60 03/10/2020    GFRAA 112 07/23/2018   CALCIUM 9.0 03/10/2020   PROT 8.1 03/10/2020   ALBUMIN 4.5 03/10/2020   LABGLOB 2.5 07/23/2018   AGRATIO 2.0 07/23/2018   BILITOT 0.6 03/10/2020   ALKPHOS 41 03/10/2020   AST 17 03/10/2020   ALT 11 03/10/2020   ANIONGAP 10 03/10/2020      Objective    There were no vitals taken for this visit. BP Readings from Last 3 Encounters:  03/11/20 115/65  12/02/18 117/68  07/23/18 131/73   Wt Readings from Last 3 Encounters:  03/10/20 147 lb (66.7 kg)  12/02/18 157 lb (71.2 kg)  07/23/18 147 lb 3.2 oz (66.8 kg)     Assessment & Plan     1. Non-recurrent acute suppurative otitis media of left ear without spontaneous rupture of tympanic membrane Suspect possible otitis media due to patient symptoms. Will treat with amoxicillin as below. Prednisone for inflammation and headache. May use tylenol prn. Call if worsening or not improving.  - predniSONE (STERAPRED UNI-PAK 21 TAB) 10 MG (21) TBPK tablet; 6 day taper; take as directed on package instructions  Dispense: 21 tablet; Refill: 0 - amoxicillin (AMOXIL) 875 MG tablet; Take 1 tablet (875 mg total) by mouth 2 (two) times daily.  Dispense: 20 tablet; Refill: 0  2. Other headache syndrome Suspect headache due to ear pain. Prednisone for inflammation and persistent headache.  - predniSONE (STERAPRED UNI-PAK 21 TAB) 10 MG (21) TBPK tablet; 6 day taper; take as directed on package instructions  Dispense: 21 tablet; Refill: 0   Return if symptoms worsen or fail to improve.    I discussed the assessment and treatment plan with the patient. The patient was provided an opportunity to ask questions and all were answered. The patient agreed with the plan and demonstrated an understanding of the instructions.   The patient was advised to call back or seek an in-person evaluation if the symptoms worsen or if the condition fails to improve as anticipated.  I provided 11 minutes of non-face-to-face time during this  encounter.  Delmer Islam, PA-C, have reviewed all documentation for this visit. The documentation on 04/18/20 for the exam, diagnosis, procedures, and orders are all accurate and complete.  Reine Just [facility] [phone] (phone) [phone] (fax)  [facility]

## 2020-04-18 NOTE — Patient Instructions (Signed)
Otitis Media, Adult  Otitis media means that the middle ear is red and swollen (inflamed) and full of fluid. The condition usually goes away on its own. Follow these instructions at home:  Take over-the-counter and prescription medicines only as told by your doctor.  If you were prescribed an antibiotic medicine, take it as told by your doctor. Do not stop taking the antibiotic even if you start to feel better.  Keep all follow-up visits as told by your doctor. This is important. Contact a doctor if:  You have bleeding from your nose.  There is a lump on your neck.  You are not getting better in 5 days.  You feel worse instead of better. Get help right away if:  You have pain that is not helped with medicine.  You have swelling, redness, or pain around your ear.  You get a stiff neck.  You cannot move part of your face (paralyzed).  You notice that the bone behind your ear hurts when you touch it.  You get a very bad headache. Summary  Otitis media means that the middle ear is red, swollen, and full of fluid.  This condition usually goes away on its own. In some cases, treatment may be needed.  If you were prescribed an antibiotic medicine, take it as told by your doctor. This information is not intended to replace advice given to you by your health care provider. Make sure you discuss any questions you have with your health care provider. Document Revised: 05/03/2017 Document Reviewed: 06/11/2016 Elsevier Patient Education  2020 [employer]vier Inc.  

## 2020-09-06 ENCOUNTER — Telehealth: Payer: Self-pay

## 2020-09-06 NOTE — Telephone Encounter (Signed)
Copied from CRM [id number]. Topic: Appointment Scheduling - Scheduling Inquiry for Clinic >> Sep 06, 2020 12:30 PM Daphine Deutscher D wrote: Reason for CRM: Stomach cramps, Lower back pain, throwing up Last night,  more bm's but not diarrhea.   She said her mom called and was advised for her to go to the urgent care.  CB#  [phone]

## 2020-09-23 ENCOUNTER — Other Ambulatory Visit: Payer: Self-pay | Admitting: Obstetrics and Gynecology

## 2020-10-25 NOTE — H&P (Signed)
Chief Complaint:   Heidi Lyons is a 25 y.o. female presenting with Pre Op Consulting  Body mass index is 26.93 kg/m.  History of Present Illness: Patient returns for a preoperative visit for Exam Under Anesthesia, Robotically-Assisted Endometriosis Excision, Lysis of Adhesions, Pap smear Collection.   Surgical indications: endometriosis, chronic pelvic pain.   Patient is s/p several interventions. She does not desire medical intervention at this time. She declines returning to [facility]. She is most recently on OCPs but requested surgical treatment since she did feel relief with her Dx lap, fulguration & excision of endometriosis, peritoneal stripping & biopsies in 02/2017.  If bowel involvement, she needs MIGS and maybe CRS. She is aware of risk of incomplete surgical management with a generalist.  Workup:  Pap 09/2020: needs to be repeated, sample did not make it to lab TVUS 10/2017:  Ut wnl; Endometrium=3.04 mm Fluid seen in cervix Free fluid seen pcds Rt ov wnl;  Complex lt ov cyst=2.30 cm  Surgical Hx: -02/2017 w/ me: Dx lap, fulguration endometriosis, posterior cul de sac peritoneal stripping, peritoneal biopsies, excision of endometriosis, chromotubation; path revealed: endometriosis in all specimens---> improvement for a while!!!  Past Medical History:  has a past medical history of Chlamydia, Dysmenorrhea, Endometriosis of uterus, and History of chickenpox.  Past Surgical History:  has a past surgical history that includes Pelvic laparoscopy and nexplanon removal (11/30/2019). Family History: family history includes Breast cancer in her mother; Polycystic kidney disease in her father. Social History:  reports that she has never smoked. She has never used smokeless tobacco. She reports current alcohol use. She reports that she does not use drugs. OB/GYN History:  OB History    Gravida  0   Para  0   Term  0   Preterm  0   AB  0   Living  0     SAB  0   IAB   0   Ectopic  0   Molar      Multiple  0   Live Births           Allergies: has No Known Allergies. Medications:  Current Outpatient Medications:  .  levonorgestrel-ethinyl estradiol (SEASONALE) 0.15 mg-30 mcg (91) tablet, Take 1 tablet by mouth once daily, Disp: 91 tablet, Rfl: 3 .  SUMAtriptan (IMITREX) 50 MG tablet, Take 1 tablet (50 mg total) by mouth as directed for Migraine May take a second dose after 2 hours if needed., Disp: 10 tablet, Rfl: 3 .  escitalopram oxalate (LEXAPRO) 10 MG tablet, Take 10 mg by mouth once daily (Patient not taking: No sig reported), Disp: , Rfl:  .  naproxen (NAPROSYN) 500 MG tablet, naproxen 500 mg tablet  TAKE 1 TABLET BY MOUTH TWICE A DAY (Patient not taking: No sig reported), Disp: , Rfl:   Review of Systems: No SOB, no palpitations or chest pain, no new lower extremity edema, no nausea or vomiting or bowel or bladder complaints. See HPI for gyn specific ROS.   Exam:   BP 116/71   Pulse 78   Ht 158.8 cm (5' 2.5")   Wt 67.9 kg (149 lb 9.6 oz)   LMP 09/01/2020   BMI 26.93 kg/m   Constitutional:  General appearance: Well nourished, well developed female in no acute distress.  CV: RRR Pulm: CTAB Neuro/psych:  Normal mood and affect. No gross motor deficits. Neck:  Supple, normal appearance.  Respiratory:  Normal respiratory effort, no use of accessory muscles Skin:  No  visible rashes or external lesions  Impression:   The primary encounter diagnosis was Pelvic pain in female. A diagnosis of Endometriosis was also pertinent to this visit.  Plan:   1. Preoperative visit -Patient returns for a preoperative discussion regarding her plans to proceed with surgical treatment of her Endometriosis, Chronic Pelvic Pain by Exam Under Anesthesia, Robotically-Assisted Endometriosis Excision, Lysis of Adhesions procedure. Will collect repeat pap smear as well. The patient and I discussed the technical aspects of the procedure including the  potential for risks and complications.  These include but are not limited to the risk of infection requiring post-operative antibiotics or further procedures.  We talked about the risk of injury to adjacent organs including bladder, bowel, ureter, blood vessels or nerves.  We talked about the need to convert to an open incision.  We talked about the possible need for blood transfusion.  We talked about postop complications such as thromboembolic or cardiopulmonary complications.  All of her questions were answered.  Her preoperative exam was completed and the appropriate consents were signed. She is scheduled to undergo this procedure in the near future.  If bowel involvement, she needs MIGS and maybe CRS. She is aware of risk of incomplete surgical management with a generalist.  Specific Peri-operative Considerations:  - Consent: obtained today   Return for Postop check - surgery scheduled for 11/11/2020.  ~~~~~~~~~~~~~~~~~~~~~~~~~~~~~~~~~~~~~~~~~~~~~~~~~~~~~~~~~~~~ This note is partially written by [facility], in the presence of and acting as the scribe of Dr. [name], who has reviewed, edited and added to the note to reflect her best personal medical judgment.  This note was generated in part with voice recognition software and I apologize for any typographical errors that were not detected and corrected.  Cecilie Kicks, MD

## 2020-11-02 ENCOUNTER — Inpatient Hospital Stay: Admission: RE | Admit: 2020-11-02 | Payer: Managed Care, Other (non HMO) | Source: Ambulatory Visit

## 2020-11-03 ENCOUNTER — Other Ambulatory Visit
Admission: RE | Admit: 2020-11-03 | Discharge: 2020-11-03 | Disposition: A | Discharge status: Home / Self Care | Payer: Managed Care, Other (non HMO) | Source: Ambulatory Visit | Attending: Obstetrics and Gynecology | Admitting: Obstetrics and Gynecology

## 2020-11-03 ENCOUNTER — Other Ambulatory Visit: Payer: Self-pay

## 2020-11-03 NOTE — Patient Instructions (Signed)
Your procedure is scheduled on: Friday November 11, 2020. Report to Day Surgery inside [facility] 2nd floor (stop by admissions desk first before getting on elevator). To find out your arrival time please call [phone] between 1PM - 3PM on Thursday November 10, 2020.  Remember: Instructions that are not followed completely may result in serious medical risk,  up to and including death, or upon the discretion of your surgeon and anesthesiologist your  surgery may need to be rescheduled.     _X__ 1. Do not eat food after midnight the night before your procedure.                 No chewing gum or hard candies. You may drink clear liquids up to 2 hours                 before you are scheduled to arrive for your surgery- DO not drink clear                 liquids within 2 hours of the start of your surgery.                 Clear Liquids include:  water, apple juice without pulp, clear Gatorade, G2 or                  Gatorade Zero (avoid Red/Purple/Blue), Black Coffee or Tea (Do not add                 anything to coffee or tea).  __X__2.  On the morning of surgery brush your teeth with toothpaste and water, you                may rinse your mouth with mouthwash if you wish.  Do not swallow any toothpaste of mouthwash.     _X__ 3.  No Alcohol for 24 hours before or after surgery.   _X__ 4.  Do Not Smoke or use e-cigarettes For 24 Hours Prior to Your Surgery.                 Do not use any chewable tobacco products for at least 6 hours prior to                 Surgery.  _X__  5.  Do not use any recreational drugs (marijuana, cocaine, heroin, ecstasy, MDMA or other)                For at least one week prior to your surgery.  Combination of these drugs with anesthesia                May have life threatening results.   __X__ 6.  Notify your doctor if there is any change in your medical condition      (cold, fever, infections).     Do not wear jewelry, make-up, hairpins,  clips or nail polish. Do not wear lotions, powders, or perfumes. You may wear deodorant. Do not shave 48 hours prior to surgery. Men may shave face and neck. Do not bring valuables to the hospital.    [facility] is not responsible for any belongings or valuables.  Contacts, dentures or bridgework may not be worn into surgery. Leave your suitcase in the car. After surgery it may be brought to your room. For patients admitted to the hospital, discharge time is determined by your treatment team.   Patients discharged the day of surgery will not be allowed to  drive home.   Make arrangements for someone to be with you for the first 24 hours of your Same Day Discharge.   __X__ Take these medicines the morning of surgery with A SIP OF WATER:    1. None   2.   3.   4.  5.  6.  ____ Fleet Enema (as directed)   ____ Use CHG Soap (or wipes) as directed  ____ Use Benzoyl Peroxide Gel as instructed  ____ Use inhalers on the day of surgery  ____ Stop metformin 2 days prior to surgery    ____ Take 1/2 of usual insulin dose the night before surgery. No insulin the morning          of surgery.   ____ Call your PCP, cardiologist, or Pulmonologist if taking Coumadin/Plavix/aspirin and ask when to stop before your surgery.   __X__ One Week prior to surgery- Stop Anti-inflammatories such as Ibuprofen, Aleve, Advil, Motrin, meloxicam (MOBIC), diclofenac, etodolac, ketorolac, Toradol, Daypro, piroxicam, Goody's or BC powders. OK TO USE TYLENOL IF NEEDED   __X__ Stop supplements until after surgery.    ____ Bring C-Pap to the hospital.    If you have any questions regarding your pre-procedure instructions,  Please call Pre-admit Testing at [phone].

## 2020-11-07 ENCOUNTER — Encounter
Admission: RE | Admit: 2020-11-07 | Discharge: 2020-11-07 | Disposition: A | Discharge status: Home / Self Care | Payer: Managed Care, Other (non HMO) | Source: Ambulatory Visit | Attending: Obstetrics and Gynecology | Admitting: Obstetrics and Gynecology

## 2020-11-07 ENCOUNTER — Other Ambulatory Visit: Payer: Self-pay

## 2020-11-07 DIAGNOSIS — Z01812 Encounter for preprocedural laboratory examination: Secondary | ICD-10-CM | POA: Diagnosis present

## 2020-11-07 LAB — CBC
HCT: 43.5 % (ref 36.0–46.0)
Hemoglobin: 14.9 g/dL (ref 12.0–15.0)
MCH: 29.7 pg (ref 26.0–34.0)
MCHC: 34.3 g/dL (ref 30.0–36.0)
MCV: 86.8 fL (ref 80.0–100.0)
Platelets: 316 10*3/uL (ref 150–400)
RBC: 5.01 MIL/uL (ref 3.87–5.11)
RDW: 12 % (ref 11.5–15.5)
WBC: 7.3 10*3/uL (ref 4.0–10.5)
nRBC: 0 % (ref 0.0–0.2)

## 2020-11-07 LAB — BASIC METABOLIC PANEL
Anion gap: 8 (ref 5–15)
BUN: 15 mg/dL (ref 6–20)
CO2: 25 mmol/L (ref 22–32)
Calcium: 9 mg/dL (ref 8.9–10.3)
Chloride: 107 mmol/L (ref 98–111)
Creatinine, Ser: 0.79 mg/dL (ref 0.44–1.00)
GFR, Estimated: >60 mL/min (ref >60)
Glucose, Bld: 76 mg/dL (ref 70–99)
Potassium: 3.7 mmol/L (ref 3.5–5.1)
Sodium: 140 mmol/L (ref 135–145)

## 2020-11-07 LAB — TYPE AND SCREEN
ABO/RH(D): B POS
Antibody Screen: NEGATIVE

## 2020-11-09 ENCOUNTER — Other Ambulatory Visit: Payer: Managed Care, Other (non HMO)

## 2020-11-10 MED ORDER — LACTATED RINGERS IV SOLN: INTRAVENOUS | Status: DC

## 2020-11-10 MED ORDER — CHLORHEXIDINE GLUCONATE 0.12 % MT SOLN: 15.0000 mL | Freq: Once | OROMUCOSAL | Status: AC

## 2020-11-10 MED ORDER — CELECOXIB 200 MG PO CAPS: 400.0000 mg | ORAL_CAPSULE | ORAL | Status: AC

## 2020-11-10 MED ORDER — GABAPENTIN 300 MG PO CAPS: 300.0000 mg | ORAL_CAPSULE | ORAL | Status: AC

## 2020-11-10 MED ORDER — ACETAMINOPHEN 500 MG PO TABS: 1000.0000 mg | ORAL_TABLET | ORAL | Status: AC

## 2020-11-10 MED ORDER — ORAL CARE MOUTH RINSE: 15.0000 mL | Freq: Once | OROMUCOSAL | Status: AC

## 2020-11-10 MED ORDER — POVIDONE-IODINE 10 % EX SWAB: 2.0000 "application " | Freq: Once | CUTANEOUS | Status: DC

## 2020-11-10 MED ORDER — FAMOTIDINE 20 MG PO TABS: 20.0000 mg | ORAL_TABLET | Freq: Once | ORAL | Status: DC

## 2020-11-11 ENCOUNTER — Other Ambulatory Visit: Payer: Self-pay

## 2020-11-11 ENCOUNTER — Encounter
Admission: RE | Disposition: A | Discharge status: Home / Self Care | Payer: Self-pay | Source: Home / Self Care | Attending: Obstetrics and Gynecology

## 2020-11-11 ENCOUNTER — Ambulatory Visit: Payer: Managed Care, Other (non HMO) | Admitting: Urgent Care

## 2020-11-11 ENCOUNTER — Encounter: Payer: Self-pay | Admitting: Obstetrics and Gynecology

## 2020-11-11 ENCOUNTER — Observation Stay
Admission: RE | Admit: 2020-11-11 | Discharge: 2020-11-12 | Disposition: A | Discharge status: Home / Self Care | Payer: Managed Care, Other (non HMO) | Attending: Obstetrics and Gynecology | Admitting: Obstetrics and Gynecology

## 2020-11-11 DIAGNOSIS — N809 Endometriosis, unspecified: Secondary | ICD-10-CM | POA: Diagnosis present

## 2020-11-11 DIAGNOSIS — R102 Pelvic and perineal pain: Secondary | ICD-10-CM | POA: Diagnosis not present

## 2020-11-11 DIAGNOSIS — N803 Endometriosis of pelvic peritoneum: Principal | ICD-10-CM | POA: Insufficient documentation

## 2020-11-11 DIAGNOSIS — G8929 Other chronic pain: Secondary | ICD-10-CM | POA: Insufficient documentation

## 2020-11-11 HISTORY — PX: ROBOTIC ASSISTED LAPAROSCOPIC LYSIS OF ADHESION: SHX6080

## 2020-11-11 LAB — POCT PREGNANCY, URINE: Preg Test, Ur: NEGATIVE

## 2020-11-11 SURGERY — LYSIS, ADHESIONS, ROBOT-ASSISTED, LAPAROSCOPIC
Anesthesia: General

## 2020-11-11 MED ORDER — GABAPENTIN 300 MG PO CAPS
ORAL_CAPSULE | ORAL | Status: AC
  Administered 2020-11-11: 300 mg via ORAL
  Filled 2020-11-11: qty 1

## 2020-11-11 MED ORDER — ONDANSETRON HCL 4 MG/2ML IJ SOLN: 4.0000 mg | Freq: Once | INTRAMUSCULAR | Status: DC | PRN

## 2020-11-11 MED ORDER — FENTANYL CITRATE (PF) 100 MCG/2ML IJ SOLN
INTRAMUSCULAR | Status: AC
  Administered 2020-11-11: 50 ug via INTRAVENOUS
  Filled 2020-11-11: qty 2

## 2020-11-11 MED ORDER — SIMETHICONE 80 MG PO CHEW
80.0000 mg | CHEWABLE_TABLET | Freq: Four times a day (QID) | ORAL | Status: DC | PRN
  Administered 2020-11-11 – 2020-11-12 (×2): 80 mg via ORAL
  Filled 2020-11-11 (×2): qty 1

## 2020-11-11 MED ORDER — MEPERIDINE HCL 25 MG/ML IJ SOLN: 6.2500 mg | INTRAMUSCULAR | Status: DC | PRN

## 2020-11-11 MED ORDER — OXYCODONE HCL 5 MG PO TABS
5.0000 mg | ORAL_TABLET | ORAL | Status: DC | PRN
  Administered 2020-11-11 – 2020-11-12 (×4): 5 mg via ORAL
  Filled 2020-11-11 (×5): qty 1

## 2020-11-11 MED ORDER — KETOROLAC TROMETHAMINE 30 MG/ML IJ SOLN
30.0000 mg | Freq: Four times a day (QID) | INTRAMUSCULAR | Status: DC
  Administered 2020-11-11 – 2020-11-12 (×2): 30 mg via INTRAVENOUS
  Filled 2020-11-11 (×2): qty 1

## 2020-11-11 MED ORDER — ACETAMINOPHEN 500 MG PO TABS: 1000.0000 mg | ORAL_TABLET | Freq: Four times a day (QID) | ORAL | 0 refills | Status: AC

## 2020-11-11 MED ORDER — ONDANSETRON HCL 4 MG/2ML IJ SOLN: 4.0000 mg | Freq: Four times a day (QID) | INTRAMUSCULAR | Status: DC | PRN

## 2020-11-11 MED ORDER — ONDANSETRON HCL 4 MG PO TABS: 4.0000 mg | ORAL_TABLET | Freq: Four times a day (QID) | ORAL | Status: DC | PRN

## 2020-11-11 MED ORDER — FENTANYL CITRATE (PF) 100 MCG/2ML IJ SOLN
INTRAMUSCULAR | Status: DC | PRN
  Administered 2020-11-11: 100 ug via INTRAVENOUS

## 2020-11-11 MED ORDER — DOCUSATE SODIUM 100 MG PO CAPS
100.0000 mg | ORAL_CAPSULE | Freq: Two times a day (BID) | ORAL | Status: DC
  Administered 2020-11-11 – 2020-11-12 (×2): 100 mg via ORAL
  Filled 2020-11-11 (×2): qty 1

## 2020-11-11 MED ORDER — HYDROCODONE-ACETAMINOPHEN 7.5-325 MG PO TABS
ORAL_TABLET | ORAL | Status: AC
  Administered 2020-11-11: 1 via ORAL
  Filled 2020-11-11: qty 1

## 2020-11-11 MED ORDER — HYDROCODONE-ACETAMINOPHEN 7.5-325 MG PO TABS: 1.0000 | ORAL_TABLET | Freq: Once | ORAL | Status: AC | PRN

## 2020-11-11 MED ORDER — FENTANYL CITRATE (PF) 100 MCG/2ML IJ SOLN
INTRAMUSCULAR | Status: AC
  Filled 2020-11-11: qty 2

## 2020-11-11 MED ORDER — CHLORHEXIDINE GLUCONATE 0.12 % MT SOLN
OROMUCOSAL | Status: AC
  Administered 2020-11-11: 15 mL via OROMUCOSAL
  Filled 2020-11-11: qty 15

## 2020-11-11 MED ORDER — HYDROCODONE-ACETAMINOPHEN 5-325 MG PO TABS
ORAL_TABLET | ORAL | Status: AC
  Filled 2020-11-11: qty 1

## 2020-11-11 MED ORDER — KETOROLAC TROMETHAMINE 30 MG/ML IJ SOLN
INTRAMUSCULAR | Status: DC | PRN
  Administered 2020-11-11: 30 mg via INTRAVENOUS

## 2020-11-11 MED ORDER — DEXAMETHASONE SODIUM PHOSPHATE 10 MG/ML IJ SOLN
INTRAMUSCULAR | Status: DC | PRN
  Administered 2020-11-11: 10 mg via INTRAVENOUS

## 2020-11-11 MED ORDER — ONDANSETRON HCL 4 MG/2ML IJ SOLN
INTRAMUSCULAR | Status: DC | PRN
  Administered 2020-11-11: 4 mg via INTRAVENOUS

## 2020-11-11 MED ORDER — BUPIVACAINE HCL (PF) 0.5 % IJ SOLN
INTRAMUSCULAR | Status: AC
  Filled 2020-11-11: qty 30

## 2020-11-11 MED ORDER — SILVER NITRATE-POT NITRATE 75-25 % EX MISC
CUTANEOUS | Status: AC
  Filled 2020-11-11: qty 20

## 2020-11-11 MED ORDER — LACTATED RINGERS IV SOLN: INTRAVENOUS | Status: DC | PRN

## 2020-11-11 MED ORDER — SUGAMMADEX SODIUM 200 MG/2ML IV SOLN
INTRAVENOUS | Status: DC | PRN
  Administered 2020-11-11: 200 mg via INTRAVENOUS

## 2020-11-11 MED ORDER — GABAPENTIN 800 MG PO TABS: 800.0000 mg | ORAL_TABLET | Freq: Every day | ORAL | 0 refills | Status: DC

## 2020-11-11 MED ORDER — OXYCODONE HCL 5 MG PO TABS: 5.0000 mg | ORAL_TABLET | Freq: Once | ORAL | Status: AC

## 2020-11-11 MED ORDER — IBUPROFEN 600 MG PO TABS: 600.0000 mg | ORAL_TABLET | Freq: Four times a day (QID) | ORAL | Status: DC

## 2020-11-11 MED ORDER — SILVER NITRATE-POT NITRATE 75-25 % EX MISC
CUTANEOUS | Status: DC | PRN
  Administered 2020-11-11: 14

## 2020-11-11 MED ORDER — IBUPROFEN 800 MG PO TABS: 800.0000 mg | ORAL_TABLET | Freq: Three times a day (TID) | ORAL | 1 refills | Status: AC

## 2020-11-11 MED ORDER — ALUM & MAG HYDROXIDE-SIMETH 200-200-20 MG/5ML PO SUSP: 30.0000 mL | ORAL | Status: DC | PRN

## 2020-11-11 MED ORDER — GABAPENTIN 300 MG PO CAPS
300.0000 mg | ORAL_CAPSULE | Freq: Every day | ORAL | Status: DC
  Administered 2020-11-11: 300 mg via ORAL
  Filled 2020-11-11: qty 1

## 2020-11-11 MED ORDER — OXYCODONE HCL 5 MG PO TABS: 5.0000 mg | ORAL_TABLET | ORAL | 0 refills | Status: DC | PRN

## 2020-11-11 MED ORDER — DOCUSATE SODIUM 100 MG PO CAPS: 100.0000 mg | ORAL_CAPSULE | Freq: Two times a day (BID) | ORAL | 0 refills | Status: DC

## 2020-11-11 MED ORDER — LACTATED RINGERS IV SOLN: INTRAVENOUS | Status: DC

## 2020-11-11 MED ORDER — MENTHOL 3 MG MT LOZG
1.0000 | LOZENGE | OROMUCOSAL | Status: DC | PRN
  Filled 2020-11-11: qty 9

## 2020-11-11 MED ORDER — ACETAMINOPHEN 10 MG/ML IV SOLN: 1000.0000 mg | Freq: Once | INTRAVENOUS | Status: DC | PRN

## 2020-11-11 MED ORDER — KETOROLAC TROMETHAMINE 30 MG/ML IJ SOLN
30.0000 mg | Freq: Once | INTRAMUSCULAR | Status: AC
  Administered 2020-11-11: 30 mg via INTRAVENOUS
  Filled 2020-11-11: qty 1

## 2020-11-11 MED ORDER — CELECOXIB 200 MG PO CAPS
ORAL_CAPSULE | ORAL | Status: AC
  Administered 2020-11-11: 400 mg via ORAL
  Filled 2020-11-11: qty 2

## 2020-11-11 MED ORDER — FENTANYL CITRATE (PF) 100 MCG/2ML IJ SOLN
25.0000 ug | INTRAMUSCULAR | Status: DC | PRN
  Administered 2020-11-11 (×2): 25 ug via INTRAVENOUS

## 2020-11-11 MED ORDER — MIDAZOLAM HCL 2 MG/2ML IJ SOLN
INTRAMUSCULAR | Status: AC
  Filled 2020-11-11: qty 2

## 2020-11-11 MED ORDER — PROPOFOL 10 MG/ML IV BOLUS
INTRAVENOUS | Status: AC
  Filled 2020-11-11: qty 20

## 2020-11-11 MED ORDER — LIDOCAINE HCL (CARDIAC) PF 100 MG/5ML IV SOSY
PREFILLED_SYRINGE | INTRAVENOUS | Status: DC | PRN
  Administered 2020-11-11: 100 mg via INTRAVENOUS

## 2020-11-11 MED ORDER — OXYCODONE HCL 5 MG PO TABS
ORAL_TABLET | ORAL | Status: AC
  Administered 2020-11-11: 5 mg via ORAL
  Filled 2020-11-11: qty 1

## 2020-11-11 MED ORDER — ACETAMINOPHEN 500 MG PO TABS
ORAL_TABLET | ORAL | Status: AC
  Administered 2020-11-11: 1000 mg via ORAL
  Filled 2020-11-11: qty 2

## 2020-11-11 MED ORDER — MIDAZOLAM HCL 2 MG/2ML IJ SOLN
INTRAMUSCULAR | Status: DC | PRN
  Administered 2020-11-11: 2 mg via INTRAVENOUS

## 2020-11-11 MED ORDER — BUPIVACAINE HCL (PF) 0.5 % IJ SOLN
INTRAMUSCULAR | Status: DC | PRN
  Administered 2020-11-11: 7 mL

## 2020-11-11 MED ORDER — ROCURONIUM BROMIDE 100 MG/10ML IV SOLN
INTRAVENOUS | Status: DC | PRN
  Administered 2020-11-11: 50 mg via INTRAVENOUS
  Administered 2020-11-11: 20 mg via INTRAVENOUS

## 2020-11-11 MED ORDER — PROPOFOL 10 MG/ML IV BOLUS
INTRAVENOUS | Status: DC | PRN
  Administered 2020-11-11: 140 mg via INTRAVENOUS

## 2020-11-11 SURGICAL SUPPLY — 69 items
BAG URINE DRAIN 2000ML AR STRL (UROLOGICAL SUPPLIES) ×3 IMPLANT
BLADE SURG SZ11 CARB STEEL (BLADE) ×3 IMPLANT
CANISTER SUCT 1200ML W/VALVE (MISCELLANEOUS) ×3 IMPLANT
CATH FOLEY 2WAY  5CC 16FR (CATHETERS) ×2
CATH URTH 16FR FL 2W BLN LF (CATHETERS) ×1 IMPLANT
CHLORAPREP W/TINT 26 (MISCELLANEOUS) ×3 IMPLANT
CLOSURE WOUND 1/2 X4 (GAUZE/BANDAGES/DRESSINGS) ×1
COVER TIP SHEARS 8 DVNC (MISCELLANEOUS) ×1 IMPLANT
COVER TIP SHEARS 8MM DA VINCI (MISCELLANEOUS) ×2
COVER WAND RF STERILE (DRAPES) ×3 IMPLANT
CUP MEDICINE 2OZ PLAST GRAD ST (MISCELLANEOUS) ×3 IMPLANT
DEFOGGER SCOPE WARMER CLEARIFY (MISCELLANEOUS) ×3 IMPLANT
DERMABOND ADVANCED (GAUZE/BANDAGES/DRESSINGS) ×2
DERMABOND ADVANCED .7 DNX12 (GAUZE/BANDAGES/DRESSINGS) ×1 IMPLANT
DRAPE 3/4 80X56 (DRAPES) ×6 IMPLANT
DRAPE ARM DVNC X/XI (DISPOSABLE) ×3 IMPLANT
DRAPE COLUMN DVNC XI (DISPOSABLE) ×1 IMPLANT
DRAPE DA VINCI XI ARM (DISPOSABLE) ×6
DRAPE DA VINCI XI COLUMN (DISPOSABLE) ×2
ELECT REM PT RETURN 9FT ADLT (ELECTROSURGICAL) ×3
ELECTRODE REM PT RTRN 9FT ADLT (ELECTROSURGICAL) ×1 IMPLANT
GAUZE 4X4 16PLY RFD (DISPOSABLE) ×3 IMPLANT
GLOVE SURG ENC MOIS LTX SZ7 (GLOVE) ×12 IMPLANT
GLOVE SURG UNDER LTX SZ7.5 (GLOVE) ×12 IMPLANT
GOWN STRL REUS W/ TWL LRG LVL3 (GOWN DISPOSABLE) ×8 IMPLANT
GOWN STRL REUS W/TWL LRG LVL3 (GOWN DISPOSABLE) ×16
GRASPER SUT TROCAR 14GX15 (MISCELLANEOUS) ×3 IMPLANT
IRRIGATION STRYKERFLOW (MISCELLANEOUS) IMPLANT
IRRIGATOR STRYKERFLOW (MISCELLANEOUS)
IV NS 1000ML (IV SOLUTION)
IV NS 1000ML BAXH (IV SOLUTION) IMPLANT
KIT PINK PAD W/HEAD ARE REST (MISCELLANEOUS) ×3
KIT PINK PAD W/HEAD ARM REST (MISCELLANEOUS) ×1 IMPLANT
KIT TURNOVER CYSTO (KITS) ×3 IMPLANT
LABEL OR SOLS (LABEL) ×3 IMPLANT
MANIFOLD NEPTUNE II (INSTRUMENTS) ×3 IMPLANT
MANIPULATOR VCARE LG CRV RETR (MISCELLANEOUS) IMPLANT
MANIPULATOR VCARE SML CRV RETR (MISCELLANEOUS) IMPLANT
MANIPULATOR VCARE STD CRV RETR (MISCELLANEOUS) IMPLANT
NS IRRIG 1000ML POUR BTL (IV SOLUTION) ×3 IMPLANT
NS IRRIG 500ML POUR BTL (IV SOLUTION) ×3 IMPLANT
OBTURATOR OPTICAL STANDARD 8MM (TROCAR) ×2
OBTURATOR OPTICAL STND 8 DVNC (TROCAR) ×1
OBTURATOR OPTICALSTD 8 DVNC (TROCAR) ×1 IMPLANT
OCCLUDER COLPOPNEUMO (BALLOONS) IMPLANT
PACK DNC HYST (MISCELLANEOUS) ×3 IMPLANT
PACK GYN LAPAROSCOPIC (MISCELLANEOUS) ×3 IMPLANT
PAD OB MATERNITY 4.3X12.25 (PERSONAL CARE ITEMS) ×3 IMPLANT
PAD PREP 24X41 OB/GYN DISP (PERSONAL CARE ITEMS) ×3 IMPLANT
PORT ACCESS TROCAR AIRSEAL 5 (TROCAR) IMPLANT
SCISSORS METZENBAUM CVD 33 (INSTRUMENTS) IMPLANT
SEAL CANN UNIV 5-8 DVNC XI (MISCELLANEOUS) ×4 IMPLANT
SEAL XI 5MM-8MM UNIVERSAL (MISCELLANEOUS) ×8
SEALER VESSEL DA VINCI XI (MISCELLANEOUS) ×2
SEALER VESSEL EXT DVNC XI (MISCELLANEOUS) ×1 IMPLANT
SET CYSTO W/LG BORE CLAMP LF (SET/KITS/TRAYS/PACK) IMPLANT
SET TRI-LUMEN FLTR TB AIRSEAL (TUBING) IMPLANT
SET TUBE SMOKE EVAC HIGH FLOW (TUBING) ×3 IMPLANT
SOL PREP PVP 2OZ (MISCELLANEOUS) ×6
SOLUTION ELECTROLUBE (MISCELLANEOUS) ×3 IMPLANT
SOLUTION PREP PVP 2OZ (MISCELLANEOUS) ×2 IMPLANT
STRIP CLOSURE SKIN 1/2X4 (GAUZE/BANDAGES/DRESSINGS) ×2 IMPLANT
SURGILUBE 2OZ TUBE FLIPTOP (MISCELLANEOUS) ×3 IMPLANT
SUT MNCRL 4-0 (SUTURE) ×2
SUT MNCRL 4-0 27XMFL (SUTURE) ×1
SUT VIC AB 0 CT2 27 (SUTURE) ×3 IMPLANT
SUT VLOC 90 2/L VL 12 GS22 (SUTURE) ×3 IMPLANT
SUTURE MNCRL 4-0 27XMF (SUTURE) ×1 IMPLANT
TOWEL OR 17X26 4PK STRL BLUE (TOWEL DISPOSABLE) ×3 IMPLANT

## 2020-11-11 NOTE — Progress Notes (Signed)
Pt is being admitted overnight to room 348, per MD's order. Report given to [place], Theola Sequin in Mother Baby department. Continue to monitor.

## 2020-11-11 NOTE — Progress Notes (Signed)
Patient sitting up in bed, talking with vistor, eating [employer] and still rating pain 7 out of 10.

## 2020-11-11 NOTE — Discharge Instructions (Signed)
Laparoscopic Ovarian Surgery Discharge Instructions  For the next three days, take ibuprofen and acetaminophen on a schedule, every 8 hours. You can take them together or you can intersperse them, and take one every four hours. I also gave you gabapentin for nighttime, to help you sleep and also to control pain. Take gabapentin medicines at night for at least the next 3 nights. You also have a narcotic, oxycodone, to take as needed if the above medicines don't help.  Postop constipation is a major cause of pain. Stay well hydrated, walk as you tolerate, and take over the counter senna as well as stool softeners if you need them.   RISKS AND COMPLICATIONS  Infection. Bleeding. Injury to surrounding organs. Anesthetic side effects.   PROCEDURE  You may be given a medicine to help you relax (sedative) before the procedure. You will be given a medicine to make you sleep (general anesthetic) during the procedure. A tube will be put down your throat to help your breath while under general anesthesia. Several small cuts (incisions) are made in the lower abdominal area and one incision is made near the belly button. Your abdominal area will be inflated with a safe gas (carbon dioxide). This helps give the surgeon room to operate, visualize, and helps the surgeon avoid other organs. A thin, lighted tube (laparoscope) with a camera attached is inserted into your abdomen through the incision near the belly button. Other small instruments may also be inserted through other abdominal incisions. The ovary is located and are removed. After the ovary is removed, the gas is released from the abdomen. The incisions will be closed with stitches (sutures), and Dermabond. A bandage may be placed over the incisions.  AFTER THE PROCEDURE  You will also have some mild abdominal discomfort for 3-7 days. You will be given pain medicine to ease any discomfort. As long as there are no problems, you may be allowed to  go home. Someone will need to drive you home and be with you for at least 24 hours once home. You may have some mild discomfort in the throat. This is from the tube placed in your throat while you were sleeping. You may experience discomfort in the shoulder area from some trapped air between the liver and diaphragm. This sensation is normal and will slowly go away on its own.  HOME CARE INSTRUCTIONS  Take all medicines as directed. Only take over-the-counter or prescription medicines for pain, discomfort, or fever as directed by your caregiver. Resume daily activities as directed. Showers are preferred over baths for 2 weeks. You may resume sexual activities in 1 week or as you feel you would like to. Do not drive while taking narcotics.  SEEK MEDICAL CARE IF: . There is increasing abdominal pain. You feel lightheaded or faint. You have the chills. You have an oral temperature above 102 F (38.9 C). There is pus-like (purulent) drainage from any of the wounds. You are unable to pass gas or have a bowel movement. You feel sick to your stomach (nauseous) or throw up (vomit) and can't control it with your medicines.  MAKE SURE YOU:  Understand these instructions. Will watch your condition. Will get help right away if you are not doing well or get worse.  ExitCare Patient Information 2013 [employer].      

## 2020-11-11 NOTE — Anesthesia Preprocedure Evaluation (Addendum)
Anesthesia Evaluation  Patient identified by MRN, date of birth, ID band Patient awake    Reviewed: Allergy & Precautions, NPO status , Patient's Chart, lab work & pertinent test results  History of Anesthesia Complications Negative for: history of anesthetic complications  Airway Mallampati: I  TM Distance: >3 FB Neck ROM: Full    Dental no notable dental hx.    Pulmonary neg pulmonary ROS,    Pulmonary exam normal        Cardiovascular Exercise Tolerance: Good METS: 5 - 7 Mets (-) hypertension(-) angina(-) CAD, (-) Past MI and (-) Cardiac Stents negative cardio ROS Normal cardiovascular exam Rhythm:Regular Rate:Normal     Neuro/Psych negative neurological ROS     GI/Hepatic negative GI ROS, Neg liver ROS,   Endo/Other  negative endocrine ROS  Renal/GU negative Renal ROS     Musculoskeletal negative musculoskeletal ROS (+)   Abdominal Normal abdominal exam  (+)   Peds  Hematology negative hematology ROS (+)   Anesthesia Other Findings   Reproductive/Obstetrics negative OB ROS                             Anesthesia Physical Anesthesia Plan  ASA: 1  Anesthesia Plan: General   Post-op Pain Management:    Induction: Intravenous  PONV Risk Score and Plan: Ondansetron and Dexamethasone  Airway Management Planned: Oral ETT  Additional Equipment:   Intra-op Plan:   Post-operative Plan: Extubation in OR  Informed Consent: I have reviewed the patients History and Physical, chart, labs and discussed the procedure including the risks, benefits and alternatives for the proposed anesthesia with the patient or authorized representative who has indicated his/her understanding and acceptance.       Plan Discussed with: CRNA  Anesthesia Plan Comments: (B/R of anesthesia d/w pt to include death, MI and CVA. Pt wishes to proceed.)        Anesthesia Quick Evaluation

## 2020-11-11 NOTE — Anesthesia Procedure Notes (Signed)
Procedure Name: Intubation Date/Time: 11/11/2020 7:44 AM Performed by: Danelle Berry, CRNA Pre-anesthesia Checklist: Patient identified, Emergency Drugs available, Suction available and Patient being monitored Patient Re-evaluated:Patient Re-evaluated prior to induction Oxygen Delivery Method: Circle system utilized Preoxygenation: Pre-oxygenation with 100% oxygen Induction Type: IV induction Ventilation: Mask ventilation without difficulty Laryngoscope Size: McGraph and 3 Grade View: Grade I Tube type: Oral Tube size: 7.0 mm Number of attempts: 1 Airway Equipment and Method: Stylet and Oral airway Placement Confirmation: ETT inserted through vocal cords under direct vision, positive ETCO2 and breath sounds checked- equal and bilateral Tube secured with: Tape Dental Injury: Teeth and Oropharynx as per pre-operative assessment

## 2020-11-11 NOTE — Progress Notes (Signed)
Pt still in 9/10 pain on  my exam. Diffuse, not localized the incisions. Will admit for overnight obs. Not peritoneal.

## 2020-11-11 NOTE — Progress Notes (Signed)
Okey per MD Dalbert Garnet for pt to get one oxycodone 5 mg same as prescription for home. Continue to monitor.

## 2020-11-11 NOTE — OR Nursing (Signed)
9826 DR. BEASLEY OBTAINED PAP SMEAR SPECIMEN WITH OFFICE SUPPLY. SHE WILL TAKE IT WITH HER AFTER PROCEDURE.

## 2020-11-11 NOTE — Interval H&P Note (Signed)
History and Physical Interval Note:  11/11/2020 7:27 AM  Heidi Lyons  has presented today for surgery, with the diagnosis of chronic pelvic pain.  The various methods of treatment have been discussed with the patient and family. After consideration of risks, benefits and other options for treatment, the patient has consented to  Procedure(s): XI ROBOTIC ASSISTED LAPAROSCOPIC LYSIS OF ADHESION AND EXCISION OF ENDOMETRIOSIS (N/A) EXAM UNDER ANESTHESIA (N/A) as a surgical intervention.  The patient's history has been reviewed, patient examined, no change in status, stable for surgery.  I have reviewed the patient's chart and labs.  Questions were answered to the patient's satisfaction.     [name]

## 2020-11-11 NOTE — Op Note (Signed)
Heidi Lyons PROCEDURE DATE: 11/11/2020  PREOPERATIVE DIAGNOSIS: Chronic pelvic pain, known endometriosis POSTOPERATIVE DIAGNOSIS: Endometriosis PROCEDURE:  - Robotically assisted operative laparoscopy - Excision of endometriosis - Fulguration of endometriosis - Lysis of adhesions (left sigmoid colon) - Pap smear - Chemical cautery of ectropion   SURGEON:  Dr. [name] ASSISTANT: CST ANESTHESIOLOGIST: Felicita Gage, MD Anesthesiologist: Felicita Gage, MD CRNA: Ginger Carne, CRNA; Danelle Berry, CRNA  INDICATIONS: 25 y.o. with history of chronic pelvic pain desiring surgical evaluation.  Prior lap excision of endometriosis in 2018 with resolution of pain.  Please see preoperative notes for further details. Risks of surgery were discussed with the patient including but not limited to: bleeding which may require transfusion or reoperation; infection which may require antibiotics; injury to bowel, bladder, ureters or other surrounding organs; need for additional procedures including laparotomy; thromboembolic phenomenon, incisional problems and other postoperative/anesthesia complications. Written informed consent was obtained.    FINDINGS:   Prominent cervical ectropion with significant friability, enough to fill the vaginal vault from just the pap smear.   Small uterus, normal ovaries and fallopian tubes bilaterally.  Significant peritoneal scarring in posterior cul de sac but not obliterated. Endometriosis on bilateral uterosacral ligaments. The left sigmoid colon is scarred to the left pelvic sidewall high and extensively. Peritoneal biopsies were taken and sent to pathology. The posterior cul de sac   No other abdominal/pelvic abnormality.  Normal upper abdomen. Normal appendix.   ANESTHESIA:    General INTRAVENOUS FLUIDS: 1000 ml ESTIMATED BLOOD LOSS: 20 ml from the cervical ectropion  URINE OUTPUT: 300 ml SPECIMENS: Peritoneal biopsies,  multiple COMPLICATIONS: None immediate  PROCEDURE IN DETAIL:  The patient had sequential compression devices applied to her lower extremities while in the preoperative area.  She was then taken to the operating room where general anesthesia was administered and was found to be adequate.  She was placed in the dorsal lithotomy position, and was prepped and draped in a sterile manner.  A Foley catheter was inserted into her bladder and attached to constant drainage and a uterine manipulator was then advanced into the uterus.  After an adequate timeout was performed, attention was turned to the abdomen where an umbilical incision was made with the scalpel.  The 8-mm robotic trocar and sleeve were then advanced without difficulty with the laparoscope under direct visualization into the abdomen.  The abdomen was then insufflated with carbon dioxide gas and adequate pneumoperitoneum was obtained.   A detailed survey of the patient's pelvis and abdomen revealed the findings as mentioned above.  2 more robotic ports were placed laterally under direct visualization.  Full survery of the pelvis and upper abdomen with pictures taken. Excision of endometriosis on left sigmoid epiploica, left and right uterosacaral ligments and the lateral fossa, but most of the endo was powderburn in the posterior cul de sac. This was excised and some peritoneal stripping in this area.   The operative site was surveyed, and it was found to be hemostatic under low pressure. Bilateral ureters noted.  No intraoperative injury to surrounding organs was noted.  Pictures were taken of the quadrants and pelvis. The abdomen was desufflated and all instruments were then removed from the patient's abdomen. The uterine manipulator was removed without complications.  All incisions were closed with 4-0 Vicryl and Dermabond.  The patient tolerated the procedures well.  All instruments, needles, and sponge counts were correct x 2. The patient  was taken to the recovery room in stable condition.

## 2020-11-11 NOTE — Discharge Summary (Signed)
1 Day Post-Op       Procedure(s): XI ROBOTIC ASSISTED LAPAROSCOPIC LYSIS OF ADHESION AND EXCISION OF ENDOMETRIOSIS, PAP SMEAR (N/A) EXAM UNDER ANESTHESIA (N/A) Subjective: The patient is doing well.  No nausea or vomiting. Pain is adequately controlled with scheduled meds.   Objective: Vital signs in last 24 hours: Temp:  [97.2 F (36.2 C)-99.7 F (37.6 C)] 98.6 F (37 C) (06/11 0754) Pulse Rate:  [57-95] 91 (06/11 0754) Resp:  [11-22] 16 (06/11 0353) BP: (89-120)/(40-91) 107/61 (06/11 0754) SpO2:  [96 %-100 %] 100 % (06/11 0754) Weight:  [66.7 kg] 66.7 kg (06/10 1558)  Intake/Output  Intake/Output Summary (Last 24 hours) at 11/12/2020 0937 Last data filed at 11/11/2020 1952 Gross per 24 hour  Intake 1000 ml  Output 1600 ml  Net -600 ml    Physical Exam:  General: Alert and oriented. CV: RRR Lungs: Clear bilaterally. GI: Soft, Nondistended. Incisions: Clean and dry.  Extremities: Nontender, no erythema, no edema.  Lab Results: Recent Labs    11/12/20 0621  HGB 12.9  HCT 37.6  WBC 14.8*  PLT 305                 Results for orders placed or performed during the hospital encounter of 11/11/20 (from the past 24 hour(s))  CBC     Status: Abnormal   Collection Time: 11/12/20  6:21 AM  Result Value Ref Range   WBC 14.8 (H) 4.0 - 10.5 K/uL   RBC 4.30 3.87 - 5.11 MIL/uL   Hemoglobin 12.9 12.0 - 15.0 g/dL   HCT 16.1 09.6 - 04.5 %   MCV 87.4 80.0 - 100.0 fL   MCH 30.0 26.0 - 34.0 pg   MCHC 34.3 30.0 - 36.0 g/dL   RDW 40.9 81.1 - 91.4 %   Platelets 305 150 - 400 K/uL   nRBC 0.0 0.0 - 0.2 %  Basic metabolic panel     Status: Abnormal   Collection Time: 11/12/20  6:21 AM  Result Value Ref Range   Sodium 139 135 - 145 mmol/L   Potassium 4.1 3.5 - 5.1 mmol/L   Chloride 111 98 - 111 mmol/L   CO2 23 22 - 32 mmol/L   Glucose, Bld 102 (H) 70 - 99 mg/dL   BUN 8 6 - 20 mg/dL   Creatinine, Ser 7.82 0.44 - 1.00 mg/dL   Calcium 8.3 (L) 8.9 - 10.3 mg/dL   GFR, Estimated  >95 >62 mL/min   Anion gap 5 5 - 15    Assessment/Plan: 1 Day Post-Op       Procedure(s): XI ROBOTIC ASSISTED LAPAROSCOPIC LYSIS OF ADHESION AND EXCISION OF ENDOMETRIOSIS, PAP SMEAR (N/A) EXAM UNDER ANESTHESIA (N/A)   Admitted from overnight monitoring after robotically assisted total laparoscopic hysterectomy with BSO for pelvic pain. On POD#1, pt was ambulating without assistance, tolerating regular PO diet, voiding spontaneously, and her pain was controlled with oral medications.   -Ambulate, Incentive spirometry -Advance diet as tolerated -Transition to oral pain medication -Discharge home today    [name], MD   LOS: 0 days   Randa Ngo 11/12/2020, 9:37 AM

## 2020-11-11 NOTE — Transfer of Care (Signed)
Immediate Anesthesia Transfer of Care Note  Patient: Heidi Lyons  Procedure(s) Performed: XI ROBOTIC ASSISTED LAPAROSCOPIC LYSIS OF ADHESION AND EXCISION OF ENDOMETRIOSIS, PAP SMEAR EXAM UNDER ANESTHESIA  Patient Location: PACU  Anesthesia Type:General  Level of Consciousness: awake, alert  and oriented  Airway & Oxygen Therapy: Patient Spontanous Breathing and Patient connected to face mask oxygen  Post-op Assessment: Report given to RN and Post -op Vital signs reviewed and stable  Post vital signs: Reviewed and stable  Last Vitals:  Vitals Value Taken Time  BP    Temp    Pulse    Resp    SpO2      Last Pain:  Vitals:   11/11/20 0712  TempSrc: Temporal  PainSc: 0-No pain         Complications: No notable events documented.

## 2020-11-11 NOTE — Progress Notes (Signed)
PACU note:  Pt able to sleep off and on. When asked about pain, she admits to still having 10/10 after receiving Fentanyl and Norco 7.5.  Ice bag in place. Abdominal sites well approximated with no drainage. States she does want to progress to postop to get up and be discharged home.  Emotional support given.  Confirmed w/Dr. Pricilla Holm if pt is ready for discharge, to proceed.  Will cont to follow.

## 2020-11-11 NOTE — Anesthesia Postprocedure Evaluation (Signed)
Anesthesia Post Note  Patient: Heidi Lyons  Procedure(s) Performed: XI ROBOTIC ASSISTED LAPAROSCOPIC LYSIS OF ADHESION AND EXCISION OF ENDOMETRIOSIS, PAP SMEAR EXAM UNDER ANESTHESIA  Patient location during evaluation: PACU Anesthesia Type: General Level of consciousness: awake and alert Pain management: pain level controlled Vital Signs Assessment: post-procedure vital signs reviewed and stable Respiratory status: spontaneous breathing, nonlabored ventilation, respiratory function stable and patient connected to nasal cannula oxygen Cardiovascular status: blood pressure returned to baseline and stable Postop Assessment: no apparent nausea or vomiting Anesthetic complications: no Comments: Pt reports pan of 9 but is ready to /c home and will take po meds for further analgesia   No notable events documented.   Last Vitals:  Vitals:   11/11/20 1230 11/11/20 1329  BP: 120/73 (!) 116/59  Pulse: 95 90  Resp: 18 18  Temp: 37.6 C   SpO2: 97% 99%    Last Pain:  Vitals:   11/11/20 1331  TempSrc:   PainSc: 9                  Felicita Gage

## 2020-11-12 ENCOUNTER — Encounter: Payer: Self-pay | Admitting: Obstetrics and Gynecology

## 2020-11-12 DIAGNOSIS — N803 Endometriosis of pelvic peritoneum: Secondary | ICD-10-CM | POA: Diagnosis not present

## 2020-11-12 LAB — CBC
HCT: 37.6 % (ref 36.0–46.0)
Hemoglobin: 12.9 g/dL (ref 12.0–15.0)
MCH: 30 pg (ref 26.0–34.0)
MCHC: 34.3 g/dL (ref 30.0–36.0)
MCV: 87.4 fL (ref 80.0–100.0)
Platelets: 305 10*3/uL (ref 150–400)
RBC: 4.3 MIL/uL (ref 3.87–5.11)
RDW: 12.2 % (ref 11.5–15.5)
WBC: 14.8 10*3/uL — ABNORMAL HIGH (ref 4.0–10.5)
nRBC: 0 % (ref 0.0–0.2)

## 2020-11-12 LAB — BASIC METABOLIC PANEL
Anion gap: 5 (ref 5–15)
BUN: 8 mg/dL (ref 6–20)
CO2: 23 mmol/L (ref 22–32)
Calcium: 8.3 mg/dL — ABNORMAL LOW (ref 8.9–10.3)
Chloride: 111 mmol/L (ref 98–111)
Creatinine, Ser: 0.77 mg/dL (ref 0.44–1.00)
GFR, Estimated: >60 mL/min (ref >60)
Glucose, Bld: 102 mg/dL — ABNORMAL HIGH (ref 70–99)
Potassium: 4.1 mmol/L (ref 3.5–5.1)
Sodium: 139 mmol/L (ref 135–145)

## 2020-11-12 MED ORDER — SIMETHICONE 80 MG PO CHEW: 80.0000 mg | CHEWABLE_TABLET | Freq: Four times a day (QID) | ORAL | 0 refills | Status: DC | PRN

## 2020-11-12 NOTE — Progress Notes (Signed)
Pt ride arrived, wheeled out by auxiliary.

## 2020-11-12 NOTE — Progress Notes (Signed)
Patient discharged home with family.  Discharge instructions, when to follow up, and prescriptions reviewed with patient.  Patient verbalized understanding. Patient will be escorted out by auxiliary.   

## 2020-11-15 LAB — SURGICAL PATHOLOGY

## 2021-06-04 NOTE — L&D Delivery Note (Signed)
Delivery Note  First Stage: Labor onset: 1400 Augmentation : cytotec, Cooke catheter, pitocin, AROM Analgesia /Anesthesia intrapartum: epidural AROM at 1821  Second Stage: Complete dilation at 0145 Onset of pushing at 0146 FHR second stage Category II, moderate variability, accelerations present, recurrent early decelerations, intermittent variable decelerations  Delivery of a viable female infant 02/16/2022 at 0627 by Donato Schultz, CNM. delivery of fetal head in OA position with restitution to LOT. No nuchal cord;  Anterior then posterior shoulders delivered easily with gentle downward traction. Baby placed on mom's pelvis, and attended to by peds. Short umbilical cord. Cord double clamped at one minute of life per Peds request, cut by FOB  Third Stage: Placenta delivered Tomasa Blase intact with 3 VC @ [phone] Placenta disposition: discarded Uterine tone firm / bleeding scant  2nd laceration identified  Anesthesia for repair: epidural Repair 2-0 Vicryl Est. Blood Loss (mL):  Complications: prolonged second stage  Mom to postpartum.  Baby to Couplet care / Skin to Skin.  Newborn: Birth Weight: TBD, infant skin-to-skin  Apgar Scores: 8, 9 Feeding planned: breast and formula

## 2021-08-16 ENCOUNTER — Encounter: Payer: Managed Care, Other (non HMO) | Admitting: Obstetrics

## 2021-08-18 DIAGNOSIS — Z3403 Encounter for supervision of normal first pregnancy, third trimester: Secondary | ICD-10-CM | POA: Insufficient documentation

## 2021-08-22 LAB — OB RESULTS CONSOLE GBS: GBS: POSITIVE

## 2021-08-22 LAB — HEPATITIS C ANTIBODY: HCV Ab: NEGATIVE

## 2021-08-22 LAB — OB RESULTS CONSOLE GC/CHLAMYDIA
Chlamydia: NEGATIVE
Neisseria Gonorrhea: NEGATIVE

## 2021-08-22 LAB — OB RESULTS CONSOLE HEPATITIS B SURFACE ANTIGEN: Hepatitis B Surface Ag: NEGATIVE

## 2021-08-22 LAB — OB RESULTS CONSOLE RPR: RPR: NONREACTIVE

## 2021-08-22 LAB — OB RESULTS CONSOLE RUBELLA ANTIBODY, IGM: Rubella: IMMUNE

## 2021-08-22 LAB — OB RESULTS CONSOLE HIV ANTIBODY (ROUTINE TESTING): HIV: NONREACTIVE

## 2021-08-22 LAB — OB RESULTS CONSOLE VARICELLA ZOSTER ANTIBODY, IGG: Varicella: IMMUNE

## 2022-01-18 LAB — OB RESULTS CONSOLE HIV ANTIBODY (ROUTINE TESTING): HIV: NONREACTIVE

## 2022-01-18 LAB — OB RESULTS CONSOLE RPR: RPR: NONREACTIVE

## 2022-01-25 DIAGNOSIS — B951 Streptococcus, group B, as the cause of diseases classified elsewhere: Secondary | ICD-10-CM | POA: Insufficient documentation

## 2022-02-13 DIAGNOSIS — O2686 Pruritic urticarial papules and plaques of pregnancy (PUPPP): Secondary | ICD-10-CM | POA: Insufficient documentation

## 2022-02-14 ENCOUNTER — Other Ambulatory Visit: Payer: Self-pay

## 2022-02-14 DIAGNOSIS — O2686 Pruritic urticarial papules and plaques of pregnancy (PUPPP): Secondary | ICD-10-CM

## 2022-02-14 DIAGNOSIS — R87613 High grade squamous intraepithelial lesion on cytologic smear of cervix (HGSIL): Secondary | ICD-10-CM

## 2022-02-14 DIAGNOSIS — B951 Streptococcus, group B, as the cause of diseases classified elsewhere: Secondary | ICD-10-CM

## 2022-02-14 DIAGNOSIS — Z3490 Encounter for supervision of normal pregnancy, unspecified, unspecified trimester: Secondary | ICD-10-CM

## 2022-02-14 DIAGNOSIS — Z8742 Personal history of other diseases of the female genital tract: Secondary | ICD-10-CM | POA: Insufficient documentation

## 2022-02-14 NOTE — Progress Notes (Signed)
G1P0 at [redacted]w[redacted]d, LMP of 05/11/2022, c/w early [place] at [redacted]w[redacted]d.  Scheduled for induction of labor for elective at term and PUPP on 02/15/2022 at 0800.   Prenatal provider: [facility] Pregnancy complicated by: 1. Encounter for elective induction of labor   2. PUPP (pruritic urticarial papules and plaques of pregnancy)   3. 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

## 2022-02-15 ENCOUNTER — Inpatient Hospital Stay
Admission: RE | Admit: 2022-02-15 | Discharge: 2022-02-17 | DRG: 807 | Disposition: A | Discharge status: Home / Self Care | Payer: Commercial Managed Care - HMO | Attending: Certified Nurse Midwife | Admitting: Certified Nurse Midwife

## 2022-02-15 ENCOUNTER — Other Ambulatory Visit: Payer: Self-pay

## 2022-02-15 ENCOUNTER — Encounter: Payer: Self-pay | Admitting: Obstetrics and Gynecology

## 2022-02-15 ENCOUNTER — Inpatient Hospital Stay: Payer: Commercial Managed Care - HMO | Admitting: General Practice

## 2022-02-15 DIAGNOSIS — O26893 Other specified pregnancy related conditions, third trimester: Secondary | ICD-10-CM | POA: Diagnosis present

## 2022-02-15 DIAGNOSIS — O2686 Pruritic urticarial papules and plaques of pregnancy (PUPPP): Principal | ICD-10-CM | POA: Diagnosis present

## 2022-02-15 DIAGNOSIS — Z3A40 40 weeks gestation of pregnancy: Secondary | ICD-10-CM | POA: Diagnosis not present

## 2022-02-15 DIAGNOSIS — Z23 Encounter for immunization: Secondary | ICD-10-CM

## 2022-02-15 DIAGNOSIS — R87613 High grade squamous intraepithelial lesion on cytologic smear of cervix (HGSIL): Principal | ICD-10-CM

## 2022-02-15 DIAGNOSIS — O99824 Streptococcus B carrier state complicating childbirth: Secondary | ICD-10-CM | POA: Diagnosis present

## 2022-02-15 DIAGNOSIS — Z3403 Encounter for supervision of normal first pregnancy, third trimester: Secondary | ICD-10-CM

## 2022-02-15 DIAGNOSIS — Z3490 Encounter for supervision of normal pregnancy, unspecified, unspecified trimester: Secondary | ICD-10-CM | POA: Diagnosis present

## 2022-02-15 DIAGNOSIS — B951 Streptococcus, group B, as the cause of diseases classified elsewhere: Secondary | ICD-10-CM | POA: Diagnosis present

## 2022-02-15 HISTORY — DX: Other complications of anesthesia, initial encounter: T88.59XA

## 2022-02-15 LAB — COMPREHENSIVE METABOLIC PANEL
ALT: 11 U/L (ref 0–44)
AST: 22 U/L (ref 15–41)
Albumin: 3 g/dL — ABNORMAL LOW (ref 3.5–5.0)
Alkaline Phosphatase: 143 U/L — ABNORMAL HIGH (ref 38–126)
Anion gap: 8 (ref 5–15)
BUN: 9 mg/dL (ref 6–20)
CO2: 18 mmol/L — ABNORMAL LOW (ref 22–32)
Calcium: 8.6 mg/dL — ABNORMAL LOW (ref 8.9–10.3)
Chloride: 111 mmol/L (ref 98–111)
Creatinine, Ser: 0.55 mg/dL (ref 0.44–1.00)
GFR, Estimated: >60 mL/min (ref >60)
Glucose, Bld: 84 mg/dL (ref 70–99)
Potassium: 3.8 mmol/L (ref 3.5–5.1)
Sodium: 137 mmol/L (ref 135–145)
Total Bilirubin: 0.3 mg/dL (ref 0.3–1.2)
Total Protein: 6.7 g/dL (ref 6.5–8.1)

## 2022-02-15 LAB — CBC
HCT: 33.2 % — ABNORMAL LOW (ref 36.0–46.0)
Hemoglobin: 10.6 g/dL — ABNORMAL LOW (ref 12.0–15.0)
MCH: 24.4 pg — ABNORMAL LOW (ref 26.0–34.0)
MCHC: 31.9 g/dL (ref 30.0–36.0)
MCV: 76.5 fL — ABNORMAL LOW (ref 80.0–100.0)
Platelets: 333 10*3/uL (ref 150–400)
RBC: 4.34 MIL/uL (ref 3.87–5.11)
RDW: 13.1 % (ref 11.5–15.5)
WBC: 11.3 10*3/uL — ABNORMAL HIGH (ref 4.0–10.5)
nRBC: 0 % (ref 0.0–0.2)

## 2022-02-15 LAB — TYPE AND SCREEN
ABO/RH(D): B POS
Antibody Screen: NEGATIVE

## 2022-02-15 MED ORDER — OXYTOCIN-SODIUM CHLORIDE 30-0.9 UT/500ML-% IV SOLN
1.0000 m[IU]/min | INTRAVENOUS | Status: DC
  Administered 2022-02-15: 2 m[IU]/min via INTRAVENOUS

## 2022-02-15 MED ORDER — EPHEDRINE 5 MG/ML INJ: 10.0000 mg | INTRAVENOUS | Status: DC | PRN

## 2022-02-15 MED ORDER — FENTANYL CITRATE (PF) 100 MCG/2ML IJ SOLN
50.0000 ug | INTRAMUSCULAR | Status: DC | PRN
  Administered 2022-02-15: 50 ug via INTRAVENOUS
  Filled 2022-02-15: qty 2

## 2022-02-15 MED ORDER — OXYTOCIN-SODIUM CHLORIDE 30-0.9 UT/500ML-% IV SOLN
2.5000 [IU]/h | INTRAVENOUS | Status: DC
  Filled 2022-02-15: qty 500

## 2022-02-15 MED ORDER — MISOPROSTOL 25 MCG QUARTER TABLET
25.0000 ug | ORAL_TABLET | ORAL | Status: DC | PRN
  Administered 2022-02-15: 25 ug via VAGINAL
  Filled 2022-02-15: qty 1

## 2022-02-15 MED ORDER — SODIUM CHLORIDE 0.9 % IV SOLN: 250.0000 mL | INTRAVENOUS | Status: DC | PRN

## 2022-02-15 MED ORDER — SODIUM CHLORIDE 0.9% FLUSH: 3.0000 mL | INTRAVENOUS | Status: DC | PRN

## 2022-02-15 MED ORDER — DIPHENHYDRAMINE HCL 50 MG/ML IJ SOLN: 12.5000 mg | INTRAMUSCULAR | Status: DC | PRN

## 2022-02-15 MED ORDER — SODIUM CHLORIDE 0.9% FLUSH: 3.0000 mL | Freq: Two times a day (BID) | INTRAVENOUS | Status: DC

## 2022-02-15 MED ORDER — ACETAMINOPHEN 500 MG PO TABS
1000.0000 mg | ORAL_TABLET | Freq: Four times a day (QID) | ORAL | Status: DC | PRN
  Administered 2022-02-16: 1000 mg via ORAL
  Filled 2022-02-15: qty 2

## 2022-02-15 MED ORDER — LACTATED RINGERS IV SOLN
500.0000 mL | Freq: Once | INTRAVENOUS | Status: AC
  Administered 2022-02-15: 500 mL via INTRAVENOUS

## 2022-02-15 MED ORDER — PHENYLEPHRINE 80 MCG/ML (10ML) SYRINGE FOR IV PUSH (FOR BLOOD PRESSURE SUPPORT): 80.0000 ug | PREFILLED_SYRINGE | INTRAVENOUS | Status: DC | PRN

## 2022-02-15 MED ORDER — MISOPROSTOL 25 MCG QUARTER TABLET
25.0000 ug | ORAL_TABLET | ORAL | Status: DC | PRN
  Administered 2022-02-15: 25 ug via ORAL
  Filled 2022-02-15: qty 1

## 2022-02-15 MED ORDER — SODIUM CHLORIDE 0.9 % IV SOLN
5.0000 10*6.[IU] | Freq: Once | INTRAVENOUS | Status: AC
  Administered 2022-02-15: 5 10*6.[IU] via INTRAVENOUS
  Filled 2022-02-15: qty 5

## 2022-02-15 MED ORDER — LACTATED RINGERS IV SOLN: 500.0000 mL | INTRAVENOUS | Status: DC | PRN

## 2022-02-15 MED ORDER — TERBUTALINE SULFATE 1 MG/ML IJ SOLN: 0.2500 mg | Freq: Once | INTRAMUSCULAR | Status: DC | PRN

## 2022-02-15 MED ORDER — AMMONIA AROMATIC IN INHA
RESPIRATORY_TRACT | Status: AC
  Filled 2022-02-15: qty 10

## 2022-02-15 MED ORDER — HYDROXYZINE HCL 25 MG PO TABS: 25.0000 mg | ORAL_TABLET | Freq: Three times a day (TID) | ORAL | Status: DC | PRN

## 2022-02-15 MED ORDER — BUPIVACAINE HCL (PF) 0.25 % IJ SOLN
INTRAMUSCULAR | Status: DC | PRN
  Administered 2022-02-15 (×2): 4 mL via EPIDURAL

## 2022-02-15 MED ORDER — FENTANYL-BUPIVACAINE-NACL 0.5-0.125-0.9 MG/250ML-% EP SOLN: 12.0000 mL/h | EPIDURAL | Status: DC | PRN

## 2022-02-15 MED ORDER — FENTANYL-BUPIVACAINE-NACL 0.5-0.125-0.9 MG/250ML-% EP SOLN
EPIDURAL | Status: DC | PRN
  Administered 2022-02-15: 12 mL/h via EPIDURAL

## 2022-02-15 MED ORDER — CALCIUM CARBONATE ANTACID 500 MG PO CHEW: 400.0000 mg | CHEWABLE_TABLET | Freq: Three times a day (TID) | ORAL | Status: DC | PRN

## 2022-02-15 MED ORDER — FENTANYL-BUPIVACAINE-NACL 0.5-0.125-0.9 MG/250ML-% EP SOLN
EPIDURAL | Status: AC
  Filled 2022-02-15: qty 250

## 2022-02-15 MED ORDER — SOD CITRATE-CITRIC ACID 500-334 MG/5ML PO SOLN: 30.0000 mL | ORAL | Status: DC | PRN

## 2022-02-15 MED ORDER — OXYTOCIN 10 UNIT/ML IJ SOLN
INTRAMUSCULAR | Status: AC
  Filled 2022-02-15: qty 2

## 2022-02-15 MED ORDER — LIDOCAINE HCL (PF) 1 % IJ SOLN
INTRAMUSCULAR | Status: DC | PRN
  Administered 2022-02-15: 2 mL via SUBCUTANEOUS

## 2022-02-15 MED ORDER — PENICILLIN G POT IN DEXTROSE 60000 UNIT/ML IV SOLN
3.0000 10*6.[IU] | INTRAVENOUS | Status: DC
  Administered 2022-02-15 – 2022-02-16 (×3): 3 10*6.[IU] via INTRAVENOUS
  Filled 2022-02-15 (×3): qty 50

## 2022-02-15 MED ORDER — LIDOCAINE HCL (PF) 1 % IJ SOLN
30.0000 mL | INTRAMUSCULAR | Status: DC | PRN
  Filled 2022-02-15: qty 30

## 2022-02-15 MED ORDER — ONDANSETRON HCL 4 MG/2ML IJ SOLN: 4.0000 mg | Freq: Four times a day (QID) | INTRAMUSCULAR | Status: DC | PRN

## 2022-02-15 MED ORDER — MISOPROSTOL 200 MCG PO TABS
ORAL_TABLET | ORAL | Status: AC
  Filled 2022-02-15: qty 4

## 2022-02-15 MED ORDER — LIDOCAINE-EPINEPHRINE (PF) 1.5 %-1:200000 IJ SOLN
INTRAMUSCULAR | Status: DC | PRN
  Administered 2022-02-15: 4 mL via EPIDURAL

## 2022-02-15 MED ORDER — OXYTOCIN BOLUS FROM INFUSION
333.0000 mL | Freq: Once | INTRAVENOUS | Status: AC
  Administered 2022-02-16: 333 mL via INTRAVENOUS

## 2022-02-15 MED ORDER — LACTATED RINGERS IV SOLN: INTRAVENOUS | Status: DC

## 2022-02-15 NOTE — Anesthesia Procedure Notes (Signed)
Epidural Patient location during procedure: OB Start time: 02/15/2022 3:36 PM End time: 02/15/2022 3:39 PM  Staffing Anesthesiologist: Stephanie Coup, MD Resident/CRNA: Irving Burton, CRNA Performed: resident/CRNA   Preanesthetic Checklist Completed: patient identified, IV checked, site marked, risks and benefits discussed, surgical consent, monitors and equipment checked, pre-op evaluation and timeout performed  Epidural Patient position: sitting Prep: ChloraPrep Patient monitoring: heart rate, continuous pulse ox and blood pressure Approach: midline Location: L3-L4 Injection technique: LOR saline  Needle:  Needle type: Tuohy  Needle gauge: 17 G Needle length: 9 cm and 9 Needle insertion depth: 7 cm Catheter type: closed end flexible Catheter size: 19 Gauge Catheter at skin depth: 12 cm Test dose: negative and 1.5% lidocaine with Epi 1:200 K  Assessment Sensory level: T10 Events: blood not aspirated, injection not painful, no injection resistance, no paresthesia and negative IV test  Additional Notes 1 attempt Pt. Evaluated and documentation done after procedure finished. Patient identified. Risks/Benefits/Options discussed with patient including but not limited to bleeding, infection, nerve damage, paralysis, failed block, incomplete pain control, headache, blood pressure changes, nausea, vomiting, reactions to medication both or allergic, itching and postpartum back pain. Confirmed with bedside nurse the patient's most recent platelet count. Confirmed with patient that they are not currently taking any anticoagulation, have any bleeding history or any family history of bleeding disorders. Patient expressed understanding and wished to proceed. All questions were answered. Sterile technique was used throughout the entire procedure. Please see nursing notes for vital signs. Test dose was given through epidural catheter and negative prior to continuing to dose epidural or  start infusion. Warning signs of high block given to the patient including shortness of breath, tingling/numbness in hands, complete motor block, or any concerning symptoms with instructions to call for help. Patient was given instructions on fall risk and not to get out of bed. All questions and concerns addressed with instructions to call with any issues or inadequate analgesia.    Patient tolerated the insertion well without immediate complications.Reason for block:procedure for pain

## 2022-02-15 NOTE — Progress Notes (Signed)
Labor Progress Note  Heidi Lyons is a 26 y.o. G1P0 at [redacted]w[redacted]d by LMP admitted for induction of labor due to PUPPS AND ELECTIVE  TERM IOL.   Subjective: FEELING CONTRACTIONS, MILD.   Objective: BP (!) 110/56   Pulse 77   Temp 98.3 F (36.8 C) (Oral)   Resp 18   Ht 5\' 3"  (1.6 m)   Wt 88.9 kg   LMP 05/11/2021 (Exact Date) Comment: c/w 14/01/2021 at [redacted]w[redacted]d on 08/21/2021  SpO2 100%   BMI 34.72 kg/m  Notable VS details: REVIEWED.    Fetal Assessment: FHT:  FHR: 125 bpm, variability: moderate,  accelerations:  Present,  decelerations:  Absent Category/reactivity:  Category I UC:   regular, every 2-4 minutes SVE:   SOFT/POSTERIOR; 3/50/-2; NORMAL BLOODY SHOW NOTED. - COOK CATH PLACED AND BALLOONS INFLATED WITH 08/23/2021 EACH.  - PRE-MEDICATION WITH FENTANYL, PT DID NOT TOLERATE EXAM OR PROCEDURE WELL.   Membrane status:INTACT Amniotic color: N/A  Labs: Lab Results  Component Value Date   WBC 11.3 (H) 02/15/2022   HGB 10.6 (L) 02/15/2022   HCT 33.2 (L) 02/15/2022   MCV 76.5 (L) 02/15/2022   PLT 333 02/15/2022    Assessment / Plan: ELECTIVE IOL AT [redacted]W[redacted]D, DUE TO PUPPS.   Labor:  CYTOTEC X 1 DOSE, NOW COOK CATH PLACED AND PLAN TO START LOW DOSE PITOCIN.  Preeclampsia:   N/A Fetal Wellbeing:  Category I Pain Control:  IV pain meds, MAY  HAVE EPIDURAL ON REQUEST. I/D:  GBS POS- PLAN TO START ABX NOW.  Anticipated MOD:  NSVD  02/17/2022, CNM 02/15/2022, 4:57 PM

## 2022-02-15 NOTE — H&P (Signed)
OB History & Physical   History of Present Illness:   Chief Complaint: scheduled IOL   HPI:  Tom B Petron is a 26 y.o. G1P0 female at [redacted]w[redacted]d, Patient's last menstrual period was 05/11/2021 (exact date)., consistent with [place] at [redacted]w[redacted]d, with Estimated Date of Delivery: 02/15/22.  She presents to L&D for scheduled IOL for elective at term and PUPPs.  Reports active fetal movement  Contractions: denies  LOF/SROM: denies  Vaginal bleeding: denies   Factors complicating pregnancy:  1. Encounter for elective induction of labor   2. PUPP (pruritic urticarial papules and plaques of pregnancy)   3. Positive GBS test   4. HGSIL on Pap smear of cervix     Patient Active Problem List   Diagnosis Date Noted   History of ovarian cyst 02/14/2022   HGSIL on Pap smear of cervix 02/14/2022   Encounter for elective induction of labor 02/14/2022   PUPP (pruritic urticarial papules and plaques of pregnancy) 02/13/2022   Positive GBS test 01/25/2022   Encounter for supervision of normal first pregnancy in third trimester 08/18/2021   Endometriosis determined by laparoscopy 11/11/2020   Chronic pelvic pain in female 12/02/2018    Prenatal Transfer Tool  Maternal Diabetes: No Genetic Screening: Normal Maternal Ultrasounds/Referrals: Normal Fetal Ultrasounds or other Referrals:  None Maternal Substance Abuse:  No Significant Maternal Medications:  None Significant Maternal Lab Results: Group B Strep positive  Maternal Medical History:   Past Medical History:  Diagnosis Date   Anxiety    Chronic pelvic pain in female    Depression    Endometriosis     Past Surgical History:  Procedure Laterality Date   CHROMOPERTUBATION Bilateral 02/08/2017   Procedure: CHROMOPERTUBATION;  Surgeon: [name], MD;  Location: [facility] ORS;  Service: Gynecology;  Laterality: Bilateral;   LAPAROSCOPY N/A 02/08/2017   Procedure: LAPAROSCOPY OPERATIVE WITH PERITONEAL BIOPSIES AND FULGERATION OF ENDOMETRIOSIS;   Surgeon: [name], MD;  Location: [facility] ORS;  Service: Gynecology;  Laterality: N/A;   NO PAST SURGERIES     ROBOTIC ASSISTED LAPAROSCOPIC LYSIS OF ADHESION N/A 11/11/2020   Procedure: XI ROBOTIC ASSISTED LAPAROSCOPIC LYSIS OF ADHESION AND EXCISION OF ENDOMETRIOSIS, PAP SMEAR;  Surgeon: [name], MD;  Location: [facility] ORS;  Service: Gynecology;  Laterality: N/A;    No Known Allergies  Prior to Admission medications   Medication Sig Start Date End Date Taking? Authorizing Provider  hydrOXYzine (VISTARIL) 25 MG capsule Take 25 mg by mouth 3 (three) times daily. 02/13/22   [provider]  Prenatal Vit-Fe Fumarate-FA (PRENATAL/FOLIC ACID) TABS Take by mouth.    [provider]     Prenatal care site:  [facility]  OB History  Gravida Para Term Preterm AB Living  1 0 0 0 0 0  SAB IAB Ectopic Multiple Live Births  0 0 0 0 0    # Outcome Date GA Lbr Len/2nd Weight Sex Delivery Anes PTL Lv  1 Current              Social History: She  reports that she has never smoked. She has never used smokeless tobacco. She reports that she does not currently use alcohol after a past usage of about 4.0 standard drinks of alcohol per week. She reports that she does not use drugs.  Family History: family history includes Cancer in her mother and paternal uncle.   Review of Systems: A full review of systems was performed and negative except as noted in the HPI.  Physical Exam:  Vital Signs: BP 135/74   Pulse 99   LMP 05/11/2021 (Exact Date) Comment: c/w [place] at [redacted]w[redacted]d on 08/21/2021  General: no acute distress.  HEENT: normocephalic, atraumatic Heart: regular rate & rhythm Lungs: normal respiratory effort Abdomen: soft, gravid, non-tender;  EFW: 8 lbs  Pelvic:   External: Normal external female genitalia  Cervix: Dilation: Fingertip / Effacement (%): 60 / Station: -2    Extremities: non-tender, symmetric, Mild, dependent edema bilaterally.  DTRs: 2+/2+   Neurologic: Alert & oriented x 3.    Results for orders placed or performed during the hospital encounter of 02/15/22 (from the past 24 hour(s))  CBC     Status: Abnormal   Collection Time: 02/15/22  8:57 AM  Result Value Ref Range   WBC 11.3 (H) 4.0 - 10.5 K/uL   RBC 4.34 3.87 - 5.11 MIL/uL   Hemoglobin 10.6 (L) 12.0 - 15.0 g/dL   HCT 83.1 (L) 51.7 - 61.6 %   MCV 76.5 (L) 80.0 - 100.0 fL   MCH 24.4 (L) 26.0 - 34.0 pg   MCHC 31.9 30.0 - 36.0 g/dL   RDW 07.3 71.0 - 62.6 %   Platelets 333 150 - 400 K/uL   nRBC 0.0 0.0 - 0.2 %  Type and screen     Status: None (Preliminary result)   Collection Time: 02/15/22  8:57 AM  Result Value Ref Range   ABO/RH(D) PENDING    Antibody Screen PENDING    Sample Expiration      02/18/2022,2359 Performed at [facility], [address] [place], [place] [address]   Comprehensive metabolic panel     Status: Abnormal   Collection Time: 02/15/22  8:57 AM  Result Value Ref Range   Sodium 137 135 - 145 mmol/L   Potassium 3.8 3.5 - 5.1 mmol/L   Chloride 111 98 - 111 mmol/L   CO2 18 (L) 22 - 32 mmol/L   Glucose, Bld 84 70 - 99 mg/dL   BUN 9 6 - 20 mg/dL   Creatinine, Ser 6.27 0.44 - 1.00 mg/dL   Calcium 8.6 (L) 8.9 - 10.3 mg/dL   Total Protein 6.7 6.5 - 8.1 g/dL   Albumin 3.0 (L) 3.5 - 5.0 g/dL   AST 22 15 - 41 U/L   ALT 11 0 - 44 U/L   Alkaline Phosphatase 143 (H) 38 - 126 U/L   Total Bilirubin 0.3 0.3 - 1.2 mg/dL   GFR, Estimated >03 >50 mL/min   Anion gap 8 5 - 15    Pertinent Results:  Prenatal Labs: Blood type/Rh B pos  Antibody screen Negative    Rubella Immune (03/21 0000)   Varicella Immune  RPR Nonreactive (08/17 0000)   HBsAg Negative (03/21 0000)  Hep C NR   HIV Non-reactive (08/17 0000)   GC neg  Chlamydia neg  Genetic screening cfDNA negative   1 hour GTT 116  3 hour GTT N/A  GBS Positive/-- (03/21 0000)    FHT:  FHR: 135 bpm, variability: moderate,  accelerations:  Present,  decelerations:   Absent Category/reactivity:  Category I UC:   none   Cephalic by Leopolds and SVE   No results found.  Assessment:  Caylei B Gilbo is a 26 y.o. G1P0 female at [redacted]w[redacted]d with scheduled IOL for elective at term and PUPPs.   Plan:  1. Admit to Labor & Delivery - consents reviewed and obtained - Dr. Jean Rosenthal notified of admission and plan of care  2. Fetal Well being  - Fetal Tracing: cat 1 - Group B Streptococcus ppx indicated: GBS positive  - Will start IP prophylaxis with labor, ROM, or initiation of oxytocin  - Presentation: cephalic confirmed by SVE   3. Routine OB: - Prenatal labs reviewed, as above - Rh positive - CBC, T&S, RPR on admit - Regular diet, saline lock  4. Induction of labor  - Contractions monitored with external toco - Pelvis adequate for trial of labor  - Plan for induction with misoprostol  - Induction with oxytocin, AROM, and cervical balloon as appropriate  - Plan for  continuous fetal monitoring - Maternal pain control as desired - Anticipate vaginal delivery  5. Post Partum Planning: - Infant feeding: breast feeding - Contraception:  undecided - Tdap vaccine:  declined  - Flu vaccine:  due in season  Gustavo Lah, [place] 02/15/22 9:38 AM  Margaretmary Eddy, CNM Certified Nurse Midwife [place]  [facility]

## 2022-02-15 NOTE — Progress Notes (Signed)
Labor Progress Note  Heidi Lyons is a 26 y.o. G1P0 at [redacted]w[redacted]d by LMP admitted for induction of labor due to Elective at term.  Subjective: She is comfortable, not feeling her contractions  Objective: BP (!) 114/52   Pulse (!) 111   Temp 98.3 F (36.8 C) (Oral)   Resp 18   Ht 5\' 3"  (1.6 m)   Wt 88.9 kg   LMP 05/11/2021 (Exact Date) Comment: c/w 14/01/2021 at [redacted]w[redacted]d on 08/21/2021  SpO2 99%   BMI 34.72 kg/m  Notable VS details: reviewed  Fetal Assessment: FHT:  FHR: 140 bpm, variability: moderate,  accelerations:  Present,  decelerations:  Absent Category/reactivity:  Category I UC:   regular, every 2-3 minutes SVE:    Dilation: 8.5cm  Effacement: 70%  Station:  -1  Consistency: soft  Position: anterior  Membrane status:AROM @ 1821 Amniotic color: clear  Labs: Lab Results  Component Value Date   WBC 11.3 (H) 02/15/2022   HGB 10.6 (L) 02/15/2022   HCT 33.2 (L) 02/15/2022   MCV 76.5 (L) 02/15/2022   PLT 333 02/15/2022    Assessment / Plan: Elective IOL at [redacted]w[redacted]d for PUPPs  Labor:  Received cytotec x 1 dose, then Cooke's catheter placed and removed. Pitocin started and currently at  39mu/min. AROM with clear fluid. Progressing well. Preeclampsia:  labs stable Fetal Wellbeing:  Category I Pain Control:  Epidural I/D:   GBS positive, received first PCN at 1452, second PCN given now. Anticipated MOD:  NSVD  4m, CNM 02/15/2022, 6:31 PM

## 2022-02-15 NOTE — Anesthesia Preprocedure Evaluation (Signed)
Anesthesia Evaluation  Patient identified by MRN, date of birth, ID band Patient awake    Reviewed: Allergy & Precautions, H&P , NPO status , Patient's Chart, lab work & pertinent test results  History of Anesthesia Complications Negative for: history of anesthetic complications  Airway Mallampati: II  TM Distance: >3 FB Neck ROM: Full    Dental no notable dental hx.    Pulmonary neg pulmonary ROS,    Pulmonary exam normal        Cardiovascular Exercise Tolerance: Good METS: 5 - 7 Mets (-) hypertension(-) angina(-) CAD, (-) Past MI and (-) Cardiac Stents negative cardio ROS Normal cardiovascular exam Rhythm:Regular Rate:Normal     Neuro/Psych PSYCHIATRIC DISORDERS Anxiety Depression negative neurological ROS     GI/Hepatic negative GI ROS, Neg liver ROS,   Endo/Other  negative endocrine ROS  Renal/GU negative Renal ROS     Musculoskeletal negative musculoskeletal ROS (+)   Abdominal Normal abdominal exam  (+)   Peds  Hematology negative hematology ROS (+)   Anesthesia Other Findings   Reproductive/Obstetrics negative OB ROS                             Anesthesia Physical  Anesthesia Plan  ASA: 2  Anesthesia Plan: Epidural   Post-op Pain Management:    Induction: Intravenous  PONV Risk Score and Plan: Ondansetron and Dexamethasone  Airway Management Planned: Oral ETT  Additional Equipment:   Intra-op Plan:   Post-operative Plan: Extubation in OR  Informed Consent: I have reviewed the patients History and Physical, chart, labs and discussed the procedure including the risks, benefits and alternatives for the proposed anesthesia with the patient or authorized representative who has indicated his/her understanding and acceptance.       Plan Discussed with: CRNA and Anesthesiologist  Anesthesia Plan Comments: (B/R of anesthesia d/w pt to include death, MI and CVA. Pt  wishes to proceed.)        Anesthesia Quick Evaluation

## 2022-02-15 NOTE — Plan of Care (Signed)
Education complete.

## 2022-02-16 ENCOUNTER — Encounter: Payer: Self-pay | Admitting: Obstetrics and Gynecology

## 2022-02-16 LAB — RPR: RPR Ser Ql: NONREACTIVE

## 2022-02-16 MED ORDER — ZOLPIDEM TARTRATE 5 MG PO TABS: 5.0000 mg | ORAL_TABLET | Freq: Every evening | ORAL | Status: DC | PRN

## 2022-02-16 MED ORDER — BENZOCAINE-MENTHOL 20-0.5 % EX AERO
1.0000 | INHALATION_SPRAY | CUTANEOUS | Status: DC | PRN
  Administered 2022-02-16: 1 via TOPICAL
  Filled 2022-02-16: qty 56

## 2022-02-16 MED ORDER — PRENATAL MULTIVITAMIN CH
ORAL_TABLET | ORAL | Status: AC
  Filled 2022-02-16: qty 1

## 2022-02-16 MED ORDER — TETANUS-DIPHTH-ACELL PERTUSSIS 5-2.5-18.5 LF-MCG/0.5 IM SUSY
0.5000 mL | PREFILLED_SYRINGE | Freq: Once | INTRAMUSCULAR | Status: AC
  Administered 2022-02-17: 0.5 mL via INTRAMUSCULAR
  Filled 2022-02-16 (×2): qty 0.5

## 2022-02-16 MED ORDER — ONDANSETRON HCL 4 MG/2ML IJ SOLN: 4.0000 mg | INTRAMUSCULAR | Status: DC | PRN

## 2022-02-16 MED ORDER — SENNOSIDES-DOCUSATE SODIUM 8.6-50 MG PO TABS
2.0000 | ORAL_TABLET | Freq: Every day | ORAL | Status: DC
  Administered 2022-02-17: 2 via ORAL
  Filled 2022-02-16: qty 2

## 2022-02-16 MED ORDER — DIBUCAINE (PERIANAL) 1 % EX OINT
1.0000 | TOPICAL_OINTMENT | CUTANEOUS | Status: DC | PRN
  Administered 2022-02-16: 1 via RECTAL
  Filled 2022-02-16: qty 28

## 2022-02-16 MED ORDER — ONDANSETRON HCL 4 MG PO TABS: 4.0000 mg | ORAL_TABLET | ORAL | Status: DC | PRN

## 2022-02-16 MED ORDER — IBUPROFEN 600 MG PO TABS
ORAL_TABLET | ORAL | Status: AC
  Filled 2022-02-16: qty 1

## 2022-02-16 MED ORDER — SIMETHICONE 80 MG PO CHEW: 80.0000 mg | CHEWABLE_TABLET | ORAL | Status: DC | PRN

## 2022-02-16 MED ORDER — WITCH HAZEL-GLYCERIN EX PADS
1.0000 | MEDICATED_PAD | CUTANEOUS | Status: DC | PRN
  Administered 2022-02-16: 1 via TOPICAL
  Filled 2022-02-16: qty 100

## 2022-02-16 MED ORDER — IBUPROFEN 600 MG PO TABS
600.0000 mg | ORAL_TABLET | Freq: Four times a day (QID) | ORAL | Status: DC
  Administered 2022-02-16 – 2022-02-17 (×5): 600 mg via ORAL
  Filled 2022-02-16 (×4): qty 1

## 2022-02-16 MED ORDER — PRENATAL MULTIVITAMIN CH
1.0000 | ORAL_TABLET | Freq: Every day | ORAL | Status: DC
  Administered 2022-02-16: 1 via ORAL

## 2022-02-16 MED ORDER — OXYCODONE HCL 5 MG PO TABS: 5.0000 mg | ORAL_TABLET | ORAL | Status: DC | PRN

## 2022-02-16 MED ORDER — COCONUT OIL OIL: 1.0000 | TOPICAL_OIL | Status: DC | PRN

## 2022-02-16 MED ORDER — HYDROXYZINE HCL 25 MG PO TABS: 25.0000 mg | ORAL_TABLET | Freq: Three times a day (TID) | ORAL | Status: DC | PRN

## 2022-02-16 MED ORDER — DIPHENHYDRAMINE HCL 25 MG PO CAPS: 25.0000 mg | ORAL_CAPSULE | Freq: Four times a day (QID) | ORAL | Status: DC | PRN

## 2022-02-16 MED ORDER — HYDROCORTISONE (PERIANAL) 2.5 % EX CREA
TOPICAL_CREAM | Freq: Three times a day (TID) | CUTANEOUS | Status: DC
  Filled 2022-02-16: qty 28.35

## 2022-02-16 MED ORDER — ACETAMINOPHEN 325 MG PO TABS
650.0000 mg | ORAL_TABLET | ORAL | Status: DC | PRN
  Administered 2022-02-16: 650 mg via ORAL
  Filled 2022-02-16: qty 2

## 2022-02-16 NOTE — Progress Notes (Signed)
Post Partum Day 1  Subjective: Doing well, no complaints.  Tolerating regular diet, pain with PO meds, voiding and ambulating without difficulty.  No CP SOB Fever,Chills, N/V or leg pain; denies nipple or breast pain; no HA change of vision, RUQ/epigastric pain  Objective: BP 122/73 (BP Location: Right Arm)   Pulse 87   Temp 98 F (36.7 C) (Oral)   Resp 17   Ht 5\' 3"  (1.6 m)   Wt 88.9 kg   LMP 05/11/2021 (Exact Date) Comment: c/w 14/01/2021 at [redacted]w[redacted]d on 08/21/2021  SpO2 100%   Breastfeeding Unknown   BMI 34.72 kg/m    Physical Exam:  General: NAD Breasts: soft/nontender CV: RRR Pulm: nl effort, CTABL Abdomen: soft, NT, BS x 4 Perineum:moderate edema of mons, upper labia and clitoral hood, perineal repair well approximated, minimal edema of perineum. NO evidence of hematoma.  Lochia: small Uterine Fundus: fundus firm and 1 fb below umbilicus DVT Evaluation: no cords, ttp LEs   Recent Labs    02/15/22 0857  HGB 10.6*  HCT 33.2*  WBC 11.3*  PLT 333    Assessment/Plan: 26 y.o. G1P1001 postpartum day # 0  - Continue routine PP care: edema improved, will remove foley cath now and monitor closely.  - Lactation consult prn   Disposition: Does not desire Dc home today.     02/17/22, CNM 02/16/2022  7:44 PM

## 2022-02-16 NOTE — Progress Notes (Signed)
Labor Progress Note  Heidi Lyons is a 26 y.o. G1P0 at [redacted]w[redacted]d by LMP admitted for induction of labor due to Elective at term and PUPPs.  Subjective: She is pushing with good effort and determination  Objective: BP 116/72 (BP Location: Right Arm)   Pulse (!) 107   Temp 98.9 F (37.2 C) (Oral)   Resp 19   Ht 5\' 3"  (1.6 m)   Wt 88.9 kg   LMP 05/11/2021 (Exact Date) Comment: c/w 14/01/2021 at [redacted]w[redacted]d on 08/21/2021  SpO2 100%   BMI 34.72 kg/m  Notable VS details: reviewed  Fetal Assessment: FHT:  FHR: 140 bpm, variability: moderate,  accelerations:  Present,  decelerations:  Present recurrent early decelerations Category/reactivity:  Category I UC:   regular, every 2-4 minutes SVE:    Dilation: 10 cm  Effacement: 100%  Station:  +2  Consistency: soft  Position: middle  Membrane status:AROM @ 1821 Amniotic color: clear  Labs: Lab Results  Component Value Date   WBC 11.3 (H) 02/15/2022   HGB 10.6 (L) 02/15/2022   HCT 33.2 (L) 02/15/2022   MCV 76.5 (L) 02/15/2022   PLT 333 02/15/2022    Assessment / Plan: Elective IOL at [redacted]w[redacted]d for PUPPs  Labor: She has been pushing since around 0200. Has now been pushing with good maternal effort for 2 hours. Very slow fetal descent, but now at +2 station. Small amount of caput present. Discussed with patient that she has been pushing well with slow descent and discussed options of continuing pushing as long as mom and baby continue to have reassuring status; calling Dr. [redacted]w[redacted]d for assessment for operative vaginal delivery assistance; or having a primary cesarean section. After considering her options, Heidi Lyons decides to continue pushing at this time. She has good energy levels and seems to be cheerful and determined to push. She continues pushing with good effort and slow fetal descent.  - Dr. Jean Rosenthal made aware of slow progress in second stage of labor with reassuring maternal and fetal status. Preeclampsia:  labs stable Fetal Wellbeing:  Category  I Pain Control:  Epidural I/D:   GBS positive, received  3 doses PCN Anticipated MOD:  NSVD  Jean Rosenthal, CNM 02/16/2022, 6:56 AM

## 2022-02-16 NOTE — Lactation Note (Signed)
This note was copied from a baby's chart. Lactation Consultation Note  Patient Name: Heidi Lyons [id number] Date: 02/16/2022 Reason for consult: Initial assessment;Primapara;Term Age:25 hours  Maternal Data Does the patient have breastfeeding experience prior to this delivery?: No  Feeding Mother's Current Feeding Choice: Breast Milk Mom reports pinching with latch, mom teary, rest break, baby rooting at breast, in right cradle hold on nsg pillow, after resting ,baby   latches easily, lips flanged, chin down, some pinching at first but then subsided, mom shown how to adjust lips, swallows heard, baby does not need stimulation to continue to suck.  Mom questioning whether she wants to continue breastfeeding after this feeding, options given, pumping breast and giving EBM per bottle, or formula.  LATCH Score Latch: Grasps breast easily, tongue down, lips flanged, rhythmical sucking.  Audible Swallowing: Spontaneous and intermittent  Type of Nipple: Everted at rest and after stimulation  Comfort (Breast/Nipple): Filling, red/small blisters or bruises, mild/mod discomfort  Hold (Positioning): Assistance needed to correctly position infant at breast and maintain latch.  LATCH Score: 8   Lactation Tools Discussed/Used  LC name and no written on white board  Interventions Interventions: Breast feeding basics reviewed;Assisted with latch;Skin to skin;Adjust position;Support pillows;Education  Discharge Pump: Personal WIC Program: No  Consult Status Consult Status: Follow-up Date: 02/16/22 Follow-up type: In-patient    Dyann Kief 02/16/2022, 10:10 AM

## 2022-02-16 NOTE — Progress Notes (Signed)
Labor Progress Note  Heidi Lyons is a 26 y.o. G1P0 at [redacted]w[redacted]d by LMP admitted for induction of labor due to Elective at term and PUPPs.  Subjective: She is pushing with excellent effort, small amount of fetal head visible on perineum  Objective: BP 122/69   Pulse 91   Temp 98.9 F (37.2 C) (Oral)   Resp 19   Ht 5\' 3"  (1.6 m)   Wt 88.9 kg   LMP 05/11/2021 (Exact Date) Comment: c/w 14/01/2021 at [redacted]w[redacted]d on 08/21/2021  SpO2 100%   BMI 34.72 kg/m  Notable VS details: reviewed  Fetal Assessment: FHT:  FHR: 155 bpm, variability: moderate,  accelerations:  Present,  decelerations:  Present recurrent early decelerations Category/reactivity:  Category I UC:   regular, every 2-3 minutes SVE:    Dilation: 10cm  Effacement: 100%  Station:  +3  Consistency: soft  Position: anterior  Membrane status:AROM @ 1821 Amniotic color: clear  Labs: Lab Results  Component Value Date   WBC 11.3 (H) 02/15/2022   HGB 10.6 (L) 02/15/2022   HCT 33.2 (L) 02/15/2022   MCV 76.5 (L) 02/15/2022   PLT 333 02/15/2022    Assessment / Plan: Elective IOL at [redacted]w[redacted]d for PUPPs  Labor:  She has been continuing her excellent effort with pushing. Has been actively pushing for about 4 hours. Fetal head visible on perineum. Caput present. Reassuring maternal and fetal status.  Preeclampsia:  labs stable Fetal Wellbeing:  Category I Pain Control:  Epidural I/D:   GBS positive, received 3 doses PCN Anticipated MOD:  NSVD  [redacted]w[redacted]d, CNM 02/16/2022, 7:09 AM

## 2022-02-16 NOTE — Discharge Summary (Signed)
Obstetrical Discharge Summary  Patient Name: Heidi Lyons DOB: [birth year] MRN: [id number]  Date of Admission: 02/15/2022 Date of Delivery: 02/16/2022 Delivered by: Donato Schultz, CNM Date of Discharge: 02/17/2022  Primary OB: [facility] [id number] last menstrual period was 05/11/2021 (exact date). EDC Estimated Date of Delivery: 02/15/22 Gestational Age at Delivery: [redacted]w[redacted]d   Antepartum complications:  1. Encounter for elective induction of labor   2. PUPP (pruritic urticarial papules and plaques of pregnancy)   3. Positive GBS test   4. HGSIL on Pap smear of cervix    Admitting Diagnosis: Elective IOL at 40wks, PUPPs Secondary Diagnosis: Prolonged second stage of labor, NSVD, 2nd degree laceration Patient Active Problem List   Diagnosis Date Noted   History of ovarian cyst 02/14/2022   HGSIL on Pap smear of cervix 02/14/2022   Encounter for elective induction of labor 02/14/2022   PUPP (pruritic urticarial papules and plaques of pregnancy) 02/13/2022   Positive GBS test 01/25/2022   Encounter for supervision of normal first pregnancy in third trimester 08/18/2021   Endometriosis determined by laparoscopy 11/11/2020   Chronic pelvic pain in female 12/02/2018    Augmentation: AROM, Pitocin, Cytotec, and IP Foley Complications: None Intrapartum complications/course: She arrived for elective IOL and received one dose of cytotec. She then had a Cooke catheter placed and received pitocin. AROM with clear fluid. She progressed to 10/100/+2 and pushed for approximately 4hrs , delivering viable female infant over 2nd degree laceration. Date of Delivery: 02/16/2022 Delivered By: Donato Schultz, CNM Delivery Type: spontaneous vaginal delivery Anesthesia: epidural Placenta: spontaneous Laceration: 2nd degree Episiotomy: none Newborn Data: Live born female "Heidi Lyons" Birth Weight:  9lb 7.3oz APGAR: 8, 9  Newborn Delivery   Birth date/time: 02/16/2022  06:27:00 Delivery type: Vaginal, Spontaneous      Postpartum Procedures:  IV iron Edinburgh:     02/16/2022   12:49 PM  Edinburgh Postnatal Depression Scale Screening Tool  I have been able to laugh and see the funny side of things. 3  I have looked forward with enjoyment to things. 0  I have blamed myself unnecessarily when things went wrong. 0  I have been anxious or worried for no good reason. 0  I have felt scared or panicky for no good reason. 0  Things have been getting on top of me. 0  I have been so unhappy that I have had difficulty sleeping. 2  I have felt sad or miserable. 0  I have been so unhappy that I have been crying. 0  The thought of harming myself has occurred to me. 0  Edinburgh Postnatal Depression Scale Total 5     Post partum course:   Patient had an uncomplicated postpartum course.  By time of discharge on PPD#1, her pain was controlled on oral pain medications; she had appropriate lochia and was ambulating, voiding without difficulty and tolerating regular diet.  She was deemed stable for discharge to home.    Discharge Physical Exam:   BP 118/73   Pulse 77   Temp 97.9 F (36.6 C) (Oral)   Resp 20   Ht 5\' 3"  (1.6 m)   Wt 88.9 kg   LMP 05/11/2021 (Exact Date) Comment: c/w 14/01/2021 at [redacted]w[redacted]d on 08/21/2021  SpO2 100%   Breastfeeding Unknown   BMI 34.72 kg/m   General: NAD CV: RRR Pulm: CTABL, nl effort ABD: s/nd/nt, fundus firm and below the umbilicus Lochia: moderate Perineum: well approximated, swelling has improved DVT Evaluation: LE non-ttp, no evidence of DVT  on exam.  Hemoglobin  Date Value Ref Range Status  02/17/2022 9.7 (L) 12.0 - 15.0 g/dL Final  96/28/3662 94.7 11.1 - 15.9 g/dL Final   HCT  Date Value Ref Range Status  02/17/2022 31.3 (L) 36.0 - 46.0 % Final   Hematocrit  Date Value Ref Range Status  07/23/2018 41.8 34.0 - 46.6 % Final     Disposition: stable, discharge to home. Baby Feeding: breastmilk and formula Baby  Disposition: home with mom  Rh Immune globulin given: B pos Rubella vaccine given: immune Varicella vaccine given: immune Tdap vaccine given in AP or PP setting: declined Flu vaccine given in AP or PP setting: declined  Contraception: TBD  Prenatal Labs:  Blood type/Rh B pos  Antibody screen Negative    Rubella Immune (03/21 0000)   Varicella Immune  RPR Nonreactive (08/17 0000)   HBsAg Negative (03/21 0000)  Hep C NR   HIV Non-reactive (08/17 0000)   GC neg  Chlamydia neg  Genetic screening cfDNA negative   1 hour GTT 116  3 hour GTT N/A  GBS Positive/-- (03/21 0000)      Plan:  [name] was discharged to home in good condition. Follow-up appointment with delivering provider in 6 weeks.  Discharge Medications: Allergies as of 02/17/2022   No Known Allergies      Medication List     STOP taking these medications    hydrOXYzine 25 MG capsule Commonly known as: VISTARIL       TAKE these medications    acetaminophen 325 MG tablet Commonly known as: Tylenol Take 2 tablets (650 mg total) by mouth every 4 (four) hours as needed (for pain scale < 4).   benzocaine-Menthol 20-0.5 % Aero Commonly known as: DERMOPLAST Apply 1 Application topically as needed for irritation (perineal discomfort).   hydrocortisone 2.5 % rectal cream Commonly known as: ANUSOL-HC Place rectally 3 (three) times daily.   ibuprofen 600 MG tablet Commonly known as: ADVIL Take 1 tablet (600 mg total) by mouth every 6 (six) hours.   Prenatal/Folic Acid Tabs Take by mouth.   witch hazel-glycerin pad Commonly known as: TUCKS Apply 1 Application topically as needed for hemorrhoids.         Follow-up Information     Janyce Llanos, CNM Follow up in 6 week(s).   Specialty: Certified Nurse Midwife Why: 6wk postpartum Contact information: [address] [place] [address] [phone]                 Signed:  Blanchard Kelch 02/17/2022 11:33 AM

## 2022-02-16 NOTE — Progress Notes (Addendum)
Labor Progress Note  Heidi Lyons is a 26 y.o. G1P0 at [redacted]w[redacted]d by LMP admitted for induction of labor due to Elective at term and PUPPs  Subjective: she is feeling constant rectal pressure  Objective: BP 116/72 (BP Location: Right Arm)   Pulse (!) 107   Temp 99.2 F (37.3 C) (Oral)   Resp 19   Ht 5\' 3"  (1.6 m)   Wt 88.9 kg   LMP 05/11/2021 (Exact Date) Comment: c/w 14/01/2021 at [redacted]w[redacted]d on 08/21/2021  SpO2 100%   BMI 34.72 kg/m  Notable VS details: reviewed  Fetal Assessment: FHT:  FHR: 155 bpm, variability: moderate,  accelerations:  Present,  decelerations:  Present recurrent early and variable decelerations Category/reactivity:  Category II UC:   regular, every 2-3 minutes SVE:    Dilation: 9.5cm  Effacement: 100%  Station:  +1  Consistency: soft  Position: anterior  Membrane status:AROM @ 1821 Amniotic color: clear  Labs: Lab Results  Component Value Date   WBC 11.3 (H) 02/15/2022   HGB 10.6 (L) 02/15/2022   HCT 33.2 (L) 02/15/2022   MCV 76.5 (L) 02/15/2022   PLT 333 02/15/2022    Assessment / Plan: Elective IOL at [redacted]w[redacted]d for PUPPs  Labor:  Cervical lip still present but reducible with pushing efforts. She feels rectal pressure and has good pushing effort, minimal fetal descent. Cervical lip still present after several minutes of pushing efforts. Will continue position changes to allow fetal rotation and descent and allow for cervical lip to resolve.   Preeclampsia:  labs stable Fetal Wellbeing:  Category II Pain Control:  Epidural I/D:   GBS positive, received  3 doses PCN. Low-grade maternal temperature 99.50F axillary with fetal tachycardia. 1000mg  Tylenol given and fetal heart rate returned to normal baseline. Anticipated MOD:  NSVD  [redacted]w[redacted]d, CNM 02/16/2022, 1:06 AM

## 2022-02-16 NOTE — Progress Notes (Signed)
Labor Progress Note  Heidi Lyons is a 26 y.o. G1P0 at [redacted]w[redacted]d by LMP admitted for induction of labor due to Elective at term and PUPPs  Subjective: she is pushing with good effort  Objective: BP 116/72 (BP Location: Right Arm)   Pulse (!) 107   Temp 99.2 F (37.3 C) (Oral)   Resp 19   Ht 5\' 3"  (1.6 m)   Wt 88.9 kg   LMP 05/11/2021 (Exact Date) Comment: c/w 14/01/2021 at [redacted]w[redacted]d on 08/21/2021  SpO2 100%   BMI 34.72 kg/m  Notable VS details: reviewed  Fetal Assessment: FHT:  FHR: 145 bpm, variability: moderate,  accelerations:  Present,  decelerations:  Present recurrent early and variable decelerations Category/reactivity:  Category II UC:   regular, every 3-4 minutes SVE:    Dilation: 10cm  Effacement: 100%  Station:  +1  Consistency: soft  Position: anterior  Membrane status:AROM @ 1821 Amniotic color: clear  Labs: Lab Results  Component Value Date   WBC 11.3 (H) 02/15/2022   HGB 10.6 (L) 02/15/2022   HCT 33.2 (L) 02/15/2022   MCV 76.5 (L) 02/15/2022   PLT 333 02/15/2022    Assessment / Plan: Elective IOL at [redacted]w[redacted]d for PUPPs  Labor:  10/100/+1, began pushing around 0200. Good maternal effort, slow fetal descent. Good fetal head rotation noted. Encouraging frequent position changes while pushing.  Preeclampsia:  labs stable Fetal Wellbeing:  Category II Pain Control:  Epidural I/D:   GBS positive, received 3 doses PCN. Anticipated MOD:  NSVD  [redacted]w[redacted]d, CNM 02/16/2022, 2:53 AM

## 2022-02-17 LAB — CBC
HCT: 31.3 % — ABNORMAL LOW (ref 36.0–46.0)
Hemoglobin: 9.7 g/dL — ABNORMAL LOW (ref 12.0–15.0)
MCH: 24.5 pg — ABNORMAL LOW (ref 26.0–34.0)
MCHC: 31 g/dL (ref 30.0–36.0)
MCV: 79 fL — ABNORMAL LOW (ref 80.0–100.0)
Platelets: 270 10*3/uL (ref 150–400)
RBC: 3.96 MIL/uL (ref 3.87–5.11)
RDW: 13.3 % (ref 11.5–15.5)
WBC: 14.3 10*3/uL — ABNORMAL HIGH (ref 4.0–10.5)
nRBC: 0 % (ref 0.0–0.2)

## 2022-02-17 MED ORDER — ACETAMINOPHEN 325 MG PO TABS: 650.0000 mg | ORAL_TABLET | ORAL | Status: AC | PRN

## 2022-02-17 MED ORDER — SODIUM CHLORIDE 0.9 % IV SOLN
300.0000 mg | Freq: Once | INTRAVENOUS | Status: AC
  Administered 2022-02-17: 300 mg via INTRAVENOUS
  Filled 2022-02-17: qty 300

## 2022-02-17 MED ORDER — IBUPROFEN 600 MG PO TABS: 600.0000 mg | ORAL_TABLET | Freq: Four times a day (QID) | ORAL | 0 refills | Status: AC

## 2022-02-17 MED ORDER — HYDROCORTISONE (PERIANAL) 2.5 % EX CREA: TOPICAL_CREAM | Freq: Three times a day (TID) | CUTANEOUS | 0 refills | Status: AC

## 2022-02-17 MED ORDER — WITCH HAZEL-GLYCERIN EX PADS: 1.0000 | MEDICATED_PAD | CUTANEOUS | 12 refills | Status: AC | PRN

## 2022-02-17 MED ORDER — BENZOCAINE-MENTHOL 20-0.5 % EX AERO: 1.0000 | INHALATION_SPRAY | CUTANEOUS | Status: AC | PRN

## 2022-02-17 NOTE — Progress Notes (Signed)
Post Partum Day 1  Subjective: Doing well, no complaints.  Tolerating regular diet, pain with PO meds, voiding and ambulating without difficulty.  No CP SOB Fever,Chills, N/V or leg pain; denies nipple or breast pain; no HA change of vision, RUQ/epigastric pain  Objective: BP 108/62 (BP Location: Right Arm)   Pulse 87   Temp 98 F (36.7 C) (Oral)   Resp 18   Ht 5\' 3"  (1.6 m)   Wt 88.9 kg   LMP 05/11/2021 (Exact Date) Comment: c/w [place] at [redacted]w[redacted]d on 08/21/2021  SpO2 100%   Breastfeeding Unknown   BMI 34.72 kg/m    Physical Exam:  General: NAD Breasts: soft/nontender CV: RRR Pulm: nl effort, CTABL Abdomen: soft, NT, BS x 4 Perineum: mineral edema of mons, upper labia and clitoral hood, perineal repair well approximated, minimal edema of perineum. NO evidence of hematoma.  Lochia: small Uterine Fundus: fundus firm and 1 fb below umbilicus DVT Evaluation: no cords, ttp LEs   Recent Labs    02/15/22 0857 02/17/22 0717  HGB 10.6* 9.7*  HCT 33.2* 31.3*  WBC 11.3* 14.3*  PLT 333 270    Assessment/Plan: 26 y.o. G1P1001 postpartum day # 1  - Continue routine PP care: edema improved, voiding well.  - encouraged bra for bottlefeeding.  - Iron infusion ordered, then may have IV removed.    Disposition: Does desire Dc home today.     Francetta Found, CNM 02/17/2022  7:54 AM

## 2022-02-17 NOTE — Progress Notes (Signed)
Patient discharged home with family.  Discharge instructions, when to follow up, and prescriptions reviewed with patient.  Patient verbalized understanding. Patient will be escorted out by auxiliary.   

## 2022-02-17 NOTE — Anesthesia Post-op Follow-up Note (Signed)
  Anesthesia Pain Follow-up Note  Patient: Heidi Lyons  Day #: 1  Date of Follow-up: 02/17/2022 Time: 8:48 AM  Last Vitals:  Vitals:   02/16/22 2300 02/17/22 0824  BP: 108/62 118/73  Pulse: 87 77  Resp: 18 20  Temp: 36.7 C 36.6 C  SpO2: 100% 100%    Level of Consciousness: alert  Pain: none   Side Effects:None  Catheter Site Exam:clean, dry, no drainage     Plan: D/C from anesthesia care at surgeon's request  [employer]

## 2022-02-17 NOTE — Anesthesia Postprocedure Evaluation (Signed)
Anesthesia Post Note  Patient: Heidi Lyons  Procedure(s) Performed: AN AD HOC LABOR EPIDURAL  Patient location during evaluation: Mother Baby Anesthesia Type: Epidural Level of consciousness: awake and alert Pain management: pain level controlled Vital Signs Assessment: post-procedure vital signs reviewed and stable Respiratory status: spontaneous breathing, nonlabored ventilation and respiratory function stable Cardiovascular status: stable Postop Assessment: no headache, no backache and epidural receding Anesthetic complications: no   No notable events documented.   Last Vitals:  Vitals:   02/16/22 2300 02/17/22 0824  BP: 108/62 118/73  Pulse: 87 77  Resp: 18 20  Temp: 36.7 C 36.6 C  SpO2: 100% 100%    Last Pain:  Vitals:   02/17/22 0824  TempSrc: Oral  PainSc:                  Dimas Millin

## 2022-02-17 NOTE — Discharge Instructions (Signed)

## 2022-08-07 ENCOUNTER — Other Ambulatory Visit
Admission: RE | Admit: 2022-08-07 | Discharge: 2022-08-07 | Disposition: A | Discharge status: Home / Self Care | Payer: Commercial Managed Care - HMO | Source: Ambulatory Visit | Attending: Family Medicine | Admitting: Family Medicine

## 2022-08-07 DIAGNOSIS — R079 Chest pain, unspecified: Secondary | ICD-10-CM | POA: Insufficient documentation

## 2022-08-07 DIAGNOSIS — R0602 Shortness of breath: Secondary | ICD-10-CM | POA: Diagnosis present

## 2022-08-07 LAB — TROPONIN I (HIGH SENSITIVITY): Troponin I (High Sensitivity): <2 ng/L (ref <18)
